# Patient Record
Sex: Female | Born: 1935 | Race: White | Hispanic: No | Smoking: Never smoker
Health system: Southern US, Community
[De-identification: ages and names within clinical notes are randomized; demographics above are authoritative.]

## PROBLEM LIST (undated history)

## (undated) DIAGNOSIS — K922 Gastrointestinal hemorrhage, unspecified: Secondary | ICD-10-CM

## (undated) DIAGNOSIS — I4891 Unspecified atrial fibrillation: Secondary | ICD-10-CM

## (undated) DIAGNOSIS — I10 Essential (primary) hypertension: Secondary | ICD-10-CM

## (undated) DIAGNOSIS — E079 Disorder of thyroid, unspecified: Secondary | ICD-10-CM

## (undated) HISTORY — PX: TONSILLECTOMY: SUR1361

## (undated) HISTORY — PX: APPENDECTOMY: SHX54

---

## 1977-04-01 HISTORY — PX: THYROIDECTOMY: SHX17

## 2004-07-02 ENCOUNTER — Inpatient Hospital Stay (HOSPITAL_COMMUNITY): Admission: EM | Admit: 2004-07-02 | Discharge: 2004-07-03 | Payer: Self-pay | Admitting: Emergency Medicine

## 2005-10-15 ENCOUNTER — Emergency Department (HOSPITAL_COMMUNITY): Admission: EM | Admit: 2005-10-15 | Discharge: 2005-10-15 | Payer: Self-pay | Admitting: Family Medicine

## 2005-12-30 ENCOUNTER — Encounter: Admission: RE | Admit: 2005-12-30 | Discharge: 2006-03-30 | Payer: Self-pay | Admitting: Endocrinology

## 2007-05-12 ENCOUNTER — Emergency Department (HOSPITAL_COMMUNITY): Admission: EM | Admit: 2007-05-12 | Discharge: 2007-05-12 | Payer: Self-pay | Admitting: Emergency Medicine

## 2010-01-16 ENCOUNTER — Emergency Department (HOSPITAL_COMMUNITY): Admission: EM | Admit: 2010-01-16 | Discharge: 2010-01-16 | Payer: Self-pay | Admitting: Family Medicine

## 2010-01-16 ENCOUNTER — Ambulatory Visit: Payer: Self-pay | Admitting: Internal Medicine

## 2010-01-16 ENCOUNTER — Inpatient Hospital Stay (HOSPITAL_COMMUNITY): Admission: EM | Admit: 2010-01-16 | Discharge: 2010-01-17 | Payer: Self-pay | Admitting: Emergency Medicine

## 2010-01-17 ENCOUNTER — Encounter (INDEPENDENT_AMBULATORY_CARE_PROVIDER_SITE_OTHER): Payer: Self-pay | Admitting: Emergency Medicine

## 2010-02-27 ENCOUNTER — Ambulatory Visit: Payer: Self-pay | Admitting: Internal Medicine

## 2010-02-27 DIAGNOSIS — I10 Essential (primary) hypertension: Secondary | ICD-10-CM

## 2010-02-27 DIAGNOSIS — I4891 Unspecified atrial fibrillation: Secondary | ICD-10-CM

## 2010-02-27 DIAGNOSIS — I480 Paroxysmal atrial fibrillation: Secondary | ICD-10-CM | POA: Insufficient documentation

## 2010-05-01 NOTE — Assessment & Plan Note (Signed)
Summary: eph/a fib/mt   Visit Type:  eph/a-fib  CC:  no complaints.  History of Present Illness: Kristi Morales him is seen following a hospitalization in mid October for atrial fibrillation which was found when she presented with symptoms of congestion. She was going quite rapidly with ventricular rate of 153. Overnight in the setting of Cardizem she converted to sinus rhythm she was discharged the following day. Evaluation at that time included an ultrasound that was normal. Coumadin was recommended for CHADS VASC score of 3. She declined. She has a history of negative Myoview in 2006.  creatinine at that time was 0.9  Current Medications (verified): 1)  Synthroid 88 Mcg Tabs (Levothyroxine Sodium) .Marland Kitchen.. 1 Tablet Once Daily 2)  Fish Oil 500 Mg Caps (Omega-3 Fatty Acids) .... Once Daily 3)  Aspirin 81 Mg Tbec (Aspirin) .... Take One Tablet By Mouth Two Times A Day 4)  B-12 Dots 500 Mcg Subl (Cyanocobalamin) .... Once Daily 5)  Oscal 500/200 D-3 500-200 Mg-Unit Tabs (Calcium Carbonate-Vitamin D) .... Once Daily  Allergies (verified): No Known Drug Allergies  Vital Signs:  Patient profile:   77 year old female Height:      61 inches Weight:      128.38 pounds BMI:     24.34 Pulse rate:   103 / minute BP sitting:   176 / 88  (left arm) Cuff size:   regular  Vitals Entered By: Caralee Ates CMA (February 27, 2010 3:23 PM)  Physical Exam  General:  The patient was alert and oriented in no acute distress. HEENT Normal.  Neck veins were flat, carotids were brisk.  Lungs were clear.  Heart sounds were rapid and irregular without murmurs or gallops.  Abdomen was soft with active bowel sounds. There is no clubbing cyanosis or edema. Skin Warm and dry    EKG  Procedure date:  02/27/2010  Findings:      sinus rhythm with frequent PACs at a rate of approximately 100 beats per minute intervals 0.04/2005/23 6 Axis is 27 RSR prime and occasional PVC  Impression &  Recommendations:  Problem # 1:  ATRIAL FIBRILLATION (ICD-427.31) the patient had asymptomatic atrial fibrillation.Hence there is no indication for antiarrhythmic therapy. we discussed the risk of thromboembolism and the appropriate indications for oral anticoagulation with her CHADS VASC score of 3. She declined Coumadin again. Given her age and gender, I don't think it's appropriate to consider pradaxa Her updated medication list for this problem includes:    Aspirin 81 Mg Tbec (Aspirin) .Marland Kitchen... Take one tablet by mouth two times a day  Problem # 2:  HYPERTENSION, BENIGN (ICD-401.1) will begin a beta blocker for both bp and evidence of sinus tachycardia  Her updated medication list for this problem includes:    Aspirin 81 Mg Tbec (Aspirin) .Marland Kitchen... Take one tablet by mouth two times a day  Problem # 3:  SINUS TACHYCARDIA (ICD-427.0) as above   synthroid dose recently decreased;  hgb was normal Her updated medication list for this problem includes:    Aspirin 81 Mg Tbec (Aspirin) .Marland Kitchen... Take one tablet by mouth two times a day  Patient Instructions: 1)  Your physician recommends that you schedule a follow-up appointment in: As needed. 2)  Your physician has recommended you make the following change in your medication: Try Atenolol, Metoprolol and Inderal LA seperately and let us know which medicine works best for you.

## 2010-06-13 LAB — CBC
HCT: 33.6 % — ABNORMAL LOW (ref 36.0–46.0)
Hemoglobin: 10.8 g/dL — ABNORMAL LOW (ref 12.0–15.0)
MCHC: 32.1 g/dL (ref 30.0–36.0)
MCV: 98.8 fL (ref 78.0–100.0)
Platelets: 254 10*3/uL (ref 150–400)
RBC: 4.07 MIL/uL (ref 3.87–5.11)
RDW: 13.7 % (ref 11.5–15.5)
WBC: 6.7 10*3/uL (ref 4.0–10.5)

## 2010-06-13 LAB — LIPID PANEL
Cholesterol: 180 mg/dL (ref 0–200)
HDL: 47 mg/dL (ref >39)
LDL Cholesterol: 121 mg/dL — ABNORMAL HIGH (ref 0–99)
Total CHOL/HDL Ratio: 3.8 RATIO

## 2010-06-13 LAB — POCT I-STAT, CHEM 8
BUN: 16 mg/dL (ref 6–23)
Chloride: 107 mEq/L (ref 96–112)
Sodium: 142 mEq/L (ref 135–145)
TCO2: 26 mmol/L (ref 0–100)

## 2010-06-13 LAB — CARDIAC PANEL(CRET KIN+CKTOT+MB+TROPI)
CK, MB: 1.5 ng/mL (ref 0.3–4.0)
CK, MB: 1.6 ng/mL (ref 0.3–4.0)
Total CK: 68 U/L (ref 7–177)
Troponin I: 0.02 ng/mL (ref 0.00–0.06)

## 2010-06-13 LAB — TROPONIN I: Troponin I: 0.01 ng/mL (ref 0.00–0.06)

## 2010-06-13 LAB — CK TOTAL AND CKMB (NOT AT ARMC): Relative Index: INVALID (ref 0.0–2.5)

## 2010-06-13 LAB — POCT CARDIAC MARKERS
Myoglobin, poc: 48.3 ng/mL (ref 12–200)
Myoglobin, poc: 60.5 ng/mL (ref 12–200)

## 2010-06-13 LAB — T4, FREE: Free T4: 1.33 ng/dL (ref 0.80–1.80)

## 2010-06-13 LAB — MAGNESIUM
Magnesium: 2.1 mg/dL (ref 1.5–2.5)
Magnesium: 2.3 mg/dL (ref 1.5–2.5)

## 2010-06-13 LAB — PROTIME-INR
INR: 0.99 (ref 0.00–1.49)
Prothrombin Time: 12.8 seconds (ref 11.6–15.2)

## 2010-06-13 LAB — URINALYSIS, ROUTINE W REFLEX MICROSCOPIC
Bilirubin Urine: NEGATIVE
Ketones, ur: 40 mg/dL — AB
Nitrite: NEGATIVE
Specific Gravity, Urine: 1.013 (ref 1.005–1.030)
Urobilinogen, UA: 0.2 mg/dL (ref 0.0–1.0)

## 2010-06-13 LAB — BRAIN NATRIURETIC PEPTIDE: Pro B Natriuretic peptide (BNP): 403 pg/mL — ABNORMAL HIGH (ref 0.0–100.0)

## 2010-06-13 LAB — TSH
TSH: 0.108 u[IU]/mL — ABNORMAL LOW (ref 0.350–4.500)
TSH: 0.149 u[IU]/mL — ABNORMAL LOW (ref 0.350–4.500)

## 2010-06-13 LAB — DIFFERENTIAL
Basophils Absolute: 0 10*3/uL (ref 0.0–0.1)
Lymphocytes Relative: 27 % (ref 12–46)
Lymphs Abs: 1.8 10*3/uL (ref 0.7–4.0)
Neutrophils Relative %: 60 % (ref 43–77)

## 2010-06-13 LAB — APTT: aPTT: 28 seconds (ref 24–37)

## 2010-08-17 NOTE — H&P (Signed)
NAMEJOHNYE, KIST NO.:  [id number]   MEDICAL RECORD NO.:  [id number]          PATIENT TYPE:  EMS   LOCATION:  MAJO                         FACILITY:  [facility]   PHYSICIAN:  Elmore Guise., M.D.DATE OF BIRTH:  Nov 19, 1927   DATE OF ADMISSION:  07/02/2004  DATE OF DISCHARGE:                                HISTORY & PHYSICAL   INDICATION FOR ADMISSION:  Exertional chest pain.   PRIMARY CARE PHYSICIAN:  Jeannett Senior A. Evlyn Kanner, M.D., [facility]   HISTORY OF PRESENT ILLNESS:  The patient is a 77 year old white female with  a past medical history of hypothyroidism, who presents with a 2-week episode  of off and on substernal chest pain.  The patient reports a normal state of  health until approximately 2 weeks ago when she started having exertional  chest tightness, associated with a lot of gas.  She first though this may  be GI involvement; however, she started to notice increasing palpitations  along with her chest pain.  She continued to do her normal activities, being  very active.  Yesterday, she walked 3.5 miles without any difficulty.  Today, she went to work.  She was lifting a small pot, started having 10/10  chest pressure, felt her heart racing and shipping.  She also noted mild  shortness of breath and lightheadedness during this episode.  She went and  laid down.  After 30 minutes, her pain and palpitations subsided.  EMS on  arrival reported that her heart rate was in the 130 range, and sinus  tachycardia with a blood pressure of 180/100.  Her symptoms resolved without  any significant treatment.  She is currently resting without any distress.   REVIEW OF SYSTEMS:  Otherwise negative.   CURRENT MEDICATIONS:  Synthroid 88 mcg daily.   ALLERGIES:  Questionable allergy with an antibiotic.   FAMILY HISTORY:  Positive for coronary disease.   SOCIAL HISTORY:  No tobacco.  An occasional glass of wine.  She does  continue to work full time.   PHYSICAL EXAMINATION:  VITAL SIGNS:  She is afebrile.  Blood pressure is  140/80, pulse is in the 90's, satting 95% on room air.  GENERAL:  She is a very pleasant, elderly white female, alert and oriented  x4, in no acute distress.  NECK:  Supple.  No lymphadenopathy.  There were 2+ carotids.  No JVD, no  bruits.  LUNGS:  Clear.  HEART:  Regular with a normal S1 and S2.  No significant murmurs, gallops,  or rubs.  ABDOMEN:  Soft, nontender, nondistended.  No rebound or guarding.  EXTREMITIES:  Warm with 2+ pulses and no edema.  NEUROLOGIC:  She is intact with no focal deficits.   LABORATORY WORK:  Myoglobin of 60, going to 50, going to 48.  MB of 1.3, to  1.4, to 1.8.  Troponin I less than 0.05 x3.  Renal function showed a BUN and  creatinine of 15 and 0.8.  LFT's are normal.  Coags are normal.  CBC has a  white count of 6.7, hemoglobin of 12.3, and platelets of 258.  Chest x-ray  shows no acute cardiopulmonary disease.  An ECG shows sinus tachycardia at a  rate of 113 per minute with no significant ST-T wave changes.   IMPRESSION:  1.  Chest pain (ongoing 2 week history of increasing chest pain).  2.  History of hypothyroidism.   PLAN:  Will admit for 24-hour observation and stress test in the morning.  Aspirin and Lovenox now.  Will check a fasting lipid panel in the morning.      TWK/MEDQ  D:  07/02/2004  T:  07/02/2004  Job:  [id number]   cc:   Jeannett Senior A. Evlyn Kanner, M.D.  [address]  [place]  [place] [address]  Fax: [phone]

## 2010-08-17 NOTE — Discharge Summary (Signed)
NAMECALIANN, LECKRONE NO.:  [id number]   MEDICAL RECORD NO.:  [id number]          PATIENT TYPE:  INP   LOCATION:  3707                         FACILITY:  [facility]   PHYSICIAN:  Elmore Guise., M.D.DATE OF BIRTH:  Jan 07, 1928   DATE OF ADMISSION:  07/02/2004  DATE OF DISCHARGE:  07/03/2004                                 DISCHARGE SUMMARY   DISCHARGE DIAGNOSES:  1.  Chest pain.  2.  History of hypothyroidism.   HISTORY OF PRESENT ILLNESS:  The patient is very pleasant 77 year old white  female admitted on July 02, 2004, with two-week history of increasing  exertional chest pain.   HOSPITAL COURSE:  The patient was admitted to telemetry floor. She underwent  serial cardiac enzymes which were negative. She underwent stress Myoview  myocardial perfusion scan on July 03, 2004, showing preserved LV systolic  function and no evidence of inducible ischemia. Following procedure, she has  had no further chest pain or pressure.   She will be discharged home today to continue the following medication:  1.  Synthroid 80 mcg daily.  2.  Aspirin 81 mg daily.   ACTIVITY:  The patient may return to her normal activities starting  tomorrow, July 04, 2004, with no restrictions.   FOLLOW-UP:  The patient will follow-up with Dr. Rosine Abe in two to  three weeks and Dr. Adrian Prince at [facility] at her regularly  scheduled visit.   Please note the patient did have hypertensive blood pressure response during  her stress test with systolic blood pressure in the 200 range. However, this  normalized after stress was complete. She will check her blood pressure as  an outpatient and should this remain elevated, will start on low-dose beta  blocker at her next visit.      TWK/MEDQ  D:  07/03/2004  T:  07/03/2004  Job:  [id number]   cc:   Jeannett Senior A. Evlyn Kanner, M.D.  [address]  [place]  [place] [address]  Fax: [phone]

## 2010-12-21 LAB — I-STAT 8, (EC8 V) (CONVERTED LAB)
BUN: 19
Glucose, Bld: 146 — ABNORMAL HIGH
Hemoglobin: 15
Potassium: 4.3
Sodium: 140

## 2010-12-21 LAB — CBC
HCT: 41
Hemoglobin: 13.7
WBC: 11.5 — ABNORMAL HIGH

## 2010-12-21 LAB — DIFFERENTIAL
Eosinophils Relative: 1
Lymphocytes Relative: 5 — ABNORMAL LOW
Lymphs Abs: 0.6 — ABNORMAL LOW
Monocytes Absolute: 0.5

## 2012-01-09 ENCOUNTER — Encounter: Payer: Self-pay | Admitting: Internal Medicine

## 2013-07-05 ENCOUNTER — Ambulatory Visit: Payer: Self-pay | Admitting: Podiatry

## 2013-11-01 DIAGNOSIS — M25519 Pain in unspecified shoulder: Secondary | ICD-10-CM | POA: Diagnosis not present

## 2013-11-01 DIAGNOSIS — E559 Vitamin D deficiency, unspecified: Secondary | ICD-10-CM | POA: Diagnosis not present

## 2013-11-01 DIAGNOSIS — I4891 Unspecified atrial fibrillation: Secondary | ICD-10-CM | POA: Diagnosis not present

## 2013-11-01 DIAGNOSIS — R030 Elevated blood-pressure reading, without diagnosis of hypertension: Secondary | ICD-10-CM | POA: Diagnosis not present

## 2013-11-01 DIAGNOSIS — D649 Anemia, unspecified: Secondary | ICD-10-CM | POA: Diagnosis not present

## 2013-11-01 DIAGNOSIS — E785 Hyperlipidemia, unspecified: Secondary | ICD-10-CM | POA: Diagnosis not present

## 2013-11-01 DIAGNOSIS — M899 Disorder of bone, unspecified: Secondary | ICD-10-CM | POA: Diagnosis not present

## 2013-11-01 DIAGNOSIS — M949 Disorder of cartilage, unspecified: Secondary | ICD-10-CM | POA: Diagnosis not present

## 2013-11-01 DIAGNOSIS — E039 Hypothyroidism, unspecified: Secondary | ICD-10-CM | POA: Diagnosis not present

## 2013-11-24 DIAGNOSIS — M949 Disorder of cartilage, unspecified: Secondary | ICD-10-CM | POA: Diagnosis not present

## 2013-11-24 DIAGNOSIS — M899 Disorder of bone, unspecified: Secondary | ICD-10-CM | POA: Diagnosis not present

## 2013-12-27 DIAGNOSIS — M7500 Adhesive capsulitis of unspecified shoulder: Secondary | ICD-10-CM | POA: Diagnosis not present

## 2014-01-03 DIAGNOSIS — M7501 Adhesive capsulitis of right shoulder: Secondary | ICD-10-CM | POA: Diagnosis not present

## 2014-03-09 DIAGNOSIS — Z23 Encounter for immunization: Secondary | ICD-10-CM | POA: Diagnosis not present

## 2014-05-10 DIAGNOSIS — E559 Vitamin D deficiency, unspecified: Secondary | ICD-10-CM | POA: Diagnosis not present

## 2014-05-10 DIAGNOSIS — M859 Disorder of bone density and structure, unspecified: Secondary | ICD-10-CM | POA: Diagnosis not present

## 2014-05-10 DIAGNOSIS — I4891 Unspecified atrial fibrillation: Secondary | ICD-10-CM | POA: Diagnosis not present

## 2014-05-10 DIAGNOSIS — Z6824 Body mass index (BMI) 24.0-24.9, adult: Secondary | ICD-10-CM | POA: Diagnosis not present

## 2014-05-10 DIAGNOSIS — E039 Hypothyroidism, unspecified: Secondary | ICD-10-CM | POA: Diagnosis not present

## 2014-06-02 ENCOUNTER — Ambulatory Visit (HOSPITAL_COMMUNITY)
Admission: RE | Admit: 2014-06-02 | Discharge: 2014-06-02 | Disposition: A | Discharge status: Home / Self Care | Payer: Medicare Other | Source: Ambulatory Visit | Attending: Endocrinology | Admitting: Endocrinology

## 2014-06-02 DIAGNOSIS — Z538 Procedure and treatment not carried out for other reasons: Secondary | ICD-10-CM | POA: Insufficient documentation

## 2014-06-02 DIAGNOSIS — R030 Elevated blood-pressure reading, without diagnosis of hypertension: Secondary | ICD-10-CM | POA: Diagnosis not present

## 2014-06-02 DIAGNOSIS — I4891 Unspecified atrial fibrillation: Secondary | ICD-10-CM | POA: Diagnosis not present

## 2014-06-02 DIAGNOSIS — M859 Disorder of bone density and structure, unspecified: Secondary | ICD-10-CM | POA: Diagnosis not present

## 2014-06-02 DIAGNOSIS — Z6825 Body mass index (BMI) 25.0-25.9, adult: Secondary | ICD-10-CM | POA: Diagnosis not present

## 2014-06-02 DIAGNOSIS — E039 Hypothyroidism, unspecified: Secondary | ICD-10-CM | POA: Diagnosis not present

## 2014-06-02 NOTE — Progress Notes (Signed)
Pt arrived today ( a day early for her scheduled appointment ) for her RECLAST infusion. BP 125/94. Pt is warm and dry color pink and voicing no c/o. Her radial pulse was very irregular at a rate of 135-140. Pt denies any cardiac problems and looking at the list of medications in EPIC, she states "all I take is thyroid medication". She says she stopped her 81mg  aspirin, calcium and vitamin D and fish oil.I called Dr Rinaldo CloudSouth's office and spoke to [employer]my Roger's RN for Dr Evlyn KannerSouth and relayed this information to her.Per office notes per Amy pt has "history of afib and taking a 81 mg aspirin" RECLAST infusion was postponed today and.Amy said patient could be seen in the office today by 3pm .  I informed patient of this and she left Short Stay accompanied by friend to got to office. I asked that Amy notify Short Stay if they would like to infuse the RECLAST at another time pending patient's evaluation at office visit.

## 2014-06-03 ENCOUNTER — Other Ambulatory Visit (HOSPITAL_COMMUNITY): Payer: Self-pay | Admitting: Endocrinology

## 2014-06-03 ENCOUNTER — Ambulatory Visit (HOSPITAL_COMMUNITY): Admission: RE | Admit: 2014-06-03 | Payer: Medicare Other | Source: Ambulatory Visit

## 2014-07-04 DIAGNOSIS — E039 Hypothyroidism, unspecified: Secondary | ICD-10-CM | POA: Diagnosis not present

## 2014-07-04 DIAGNOSIS — Z7901 Long term (current) use of anticoagulants: Secondary | ICD-10-CM | POA: Diagnosis not present

## 2014-07-04 DIAGNOSIS — I4891 Unspecified atrial fibrillation: Secondary | ICD-10-CM | POA: Diagnosis not present

## 2014-07-04 DIAGNOSIS — D649 Anemia, unspecified: Secondary | ICD-10-CM | POA: Diagnosis not present

## 2014-07-04 DIAGNOSIS — E785 Hyperlipidemia, unspecified: Secondary | ICD-10-CM | POA: Diagnosis not present

## 2014-07-04 DIAGNOSIS — G459 Transient cerebral ischemic attack, unspecified: Secondary | ICD-10-CM | POA: Diagnosis not present

## 2014-07-04 DIAGNOSIS — R030 Elevated blood-pressure reading, without diagnosis of hypertension: Secondary | ICD-10-CM | POA: Diagnosis not present

## 2014-07-04 DIAGNOSIS — Z6824 Body mass index (BMI) 24.0-24.9, adult: Secondary | ICD-10-CM | POA: Diagnosis not present

## 2014-07-06 DIAGNOSIS — I340 Nonrheumatic mitral (valve) insufficiency: Secondary | ICD-10-CM | POA: Diagnosis not present

## 2014-07-06 DIAGNOSIS — Z7901 Long term (current) use of anticoagulants: Secondary | ICD-10-CM | POA: Diagnosis not present

## 2014-07-06 DIAGNOSIS — I4891 Unspecified atrial fibrillation: Secondary | ICD-10-CM | POA: Diagnosis not present

## 2014-07-06 DIAGNOSIS — G459 Transient cerebral ischemic attack, unspecified: Secondary | ICD-10-CM | POA: Diagnosis not present

## 2014-12-28 ENCOUNTER — Encounter (HOSPITAL_COMMUNITY): Payer: Self-pay | Admitting: Family Medicine

## 2014-12-28 ENCOUNTER — Emergency Department (INDEPENDENT_AMBULATORY_CARE_PROVIDER_SITE_OTHER)
Admission: EM | Admit: 2014-12-28 | Discharge: 2014-12-28 | Disposition: A | Discharge status: Home / Self Care | Payer: Medicare Other | Source: Home / Self Care | Attending: Family Medicine | Admitting: Family Medicine

## 2014-12-28 DIAGNOSIS — R609 Edema, unspecified: Secondary | ICD-10-CM | POA: Diagnosis not present

## 2014-12-28 DIAGNOSIS — I10 Essential (primary) hypertension: Secondary | ICD-10-CM | POA: Diagnosis not present

## 2014-12-28 DIAGNOSIS — I8310 Varicose veins of unspecified lower extremity with inflammation: Secondary | ICD-10-CM | POA: Diagnosis not present

## 2014-12-28 DIAGNOSIS — I4891 Unspecified atrial fibrillation: Secondary | ICD-10-CM | POA: Diagnosis not present

## 2014-12-28 DIAGNOSIS — I160 Hypertensive urgency: Secondary | ICD-10-CM

## 2014-12-28 DIAGNOSIS — I872 Venous insufficiency (chronic) (peripheral): Secondary | ICD-10-CM

## 2014-12-28 HISTORY — DX: Disorder of thyroid, unspecified: E07.9

## 2014-12-28 HISTORY — DX: Unspecified atrial fibrillation: I48.91

## 2014-12-28 NOTE — ED Notes (Signed)
Pt has a rash on both lower extremities.  Dr. Konrad Dolores triaged and assessed the pt.

## 2014-12-28 NOTE — ED Provider Notes (Signed)
CSN: [id number]     Arrival date & time 12/28/14  1500 History   First MD Initiated Contact with Patient 12/28/14 1640     Chief Complaint  Patient presents with  . Rash   (Consider location/radiation/quality/duration/timing/severity/associated sxs/prior Treatment) HPI   Left ankle rash. Medial side. Associated with lower extremity swelling bilaterally. This is all going on for 3 weeks. Rash is fairly constant. Denies any pain or itch. Denies any injury to ankle. States that she's been in her normal state of health. She is not tried anything for the rash. States that the swelling improves as she rests and is minimal in the morning. Denies any chest pain, palpitations, shortness of breath, LOC, lightheadedness, dizziness, headache, neck stiffness, fevers.  Past Medical History  Diagnosis Date  . A-fib   . Thyroid disease    History reviewed. No pertinent past surgical history. Family History  Problem Relation Age of Onset  . Cancer Neg Hx   . Diabetes Neg Hx   . Heart failure Neg Hx   . Hyperlipidemia Neg Hx    Social History  Substance Use Topics  . Smoking status: Never Smoker   . Smokeless tobacco: None  . Alcohol Use: Yes   OB History    No data available     Review of Systems Per HPI with all other pertinent systems negative.   Allergies  Review of patient's allergies indicates no known allergies.  Home Medications   Prior to Admission medications   Medication Sig Start Date End Date Taking? Authorizing Tashianna Broome  aspirin 81 MG tablet Take 81 mg by mouth daily.    Historical Anoop Hemmer, MD  Calcium Carbonate-Vitamin D (OSCAL 500/200 D-3 PO) Take by mouth.    Historical Alexsis Kathman, MD  Cyanocobalamin (VITAMIN B-12) 500 MCG SUBL Place under the tongue.    Historical Jaser Fullen, MD  fish oil-omega-3 fatty acids 1000 MG capsule Take 2 g by mouth daily.    Historical Creek Gan, MD  levothyroxine (SYNTHROID, LEVOTHROID) 88 MCG tablet Take 88 mcg by mouth daily.    Historical  Pink Maye, MD   Meds Ordered and Administered this Visit  Medications - No data to display  BP 175/105 mmHg  Pulse 154  SpO2 95% No data found.   Physical Exam Physical Exam  Constitutional: oriented to person, place, and time. appears well-developed and well-nourished. No distress.  HENT:  Head: Normocephalic and atraumatic.  Eyes: EOMI. PERRL.  Neck: Normal range of motion.  Cardiovascular: Irregularly irregular heartbeat, 2/6 systolic murmur, 2+ distal pulses  Pulmonary/Chest: Effort normal and breath sounds normal. No respiratory distress.  Abdominal: Soft. Bowel sounds are normal. NonTTP, no distension.  Musculoskeletal: Normal range of motion. Non ttp, no effusion.  Neurological: alert and oriented to person, place, and time.  Skin: Mild left medial and right medial ankle hyperemia. Nontender to palpation, nonindurated, no fluctuance.Marland Kitchen  Psychiatric: normal mood and affect. behavior is normal. Judgment and thought content normal.    ED Course  Procedures (including critical care time)  Labs Review Labs Reviewed - No data to display  Imaging Review No results found.   Visual Acuity Review  Right Eye Distance:   Left Eye Distance:   Bilateral Distance:    Right Eye Near:   Left Eye Near:    Bilateral Near:         MDM   1. Venous stasis dermatitis, unspecified laterality   2. Dependent edema   3. Atrial fibrillation with rapid ventricular response   4. Hypertensive  urgency     Calcium on improving venous stasis and dependent edema using compression stockings. Discussed likelihood of this rash persisting and possibly getting worse with time. Of considerable concern was the patient's elevated blood pressure and obvious state of A. fib and RVR. Patient is refusing all medical intervention at this time. Patient states that she takes no blood pressure medications and has refused them in the past when she has had an elevated blood pressure. She states that this  HAPPENS when she goes to the doctor and it improves after she rests. Patient was allowed to rest in the clinic for partially 20 minutes with no improvement of her heart rate or blood pressure. Patient was again counseled to go to the emergency room for treatment of her hypertensive state and rapid ventricular rate area again patient refusing further treatment for this condition despite being warned about the serious possible complications including stroke, heart attack, and other end organ injury.    Ozella Rocks, MD 12/28/14 [phone]

## 2014-12-28 NOTE — Discharge Instructions (Signed)
Rash in her legs is from rupture of small capillaries due to the swelling from poor venous flow. These skin color changes will persist and slowly lighten and turned more of a brownish color. The more your legs are allowed to swell the worse is probable get. Please consider wearing compression stockings to help with this. Of greater concern is your extreme blood pressure and heart rate. If left untreated this could predispose to 2 heart attacks and strokes. If your heart rate and blood pressure does not improve please go to the emergency room for further management.

## 2015-02-10 DIAGNOSIS — D649 Anemia, unspecified: Secondary | ICD-10-CM | POA: Diagnosis not present

## 2015-02-10 DIAGNOSIS — M859 Disorder of bone density and structure, unspecified: Secondary | ICD-10-CM | POA: Diagnosis not present

## 2015-02-10 DIAGNOSIS — Z6824 Body mass index (BMI) 24.0-24.9, adult: Secondary | ICD-10-CM | POA: Diagnosis not present

## 2015-02-10 DIAGNOSIS — E038 Other specified hypothyroidism: Secondary | ICD-10-CM | POA: Diagnosis not present

## 2015-02-10 DIAGNOSIS — R51 Headache: Secondary | ICD-10-CM | POA: Diagnosis not present

## 2015-02-10 DIAGNOSIS — E559 Vitamin D deficiency, unspecified: Secondary | ICD-10-CM | POA: Diagnosis not present

## 2015-02-10 DIAGNOSIS — I10 Essential (primary) hypertension: Secondary | ICD-10-CM | POA: Diagnosis not present

## 2015-02-10 DIAGNOSIS — Z23 Encounter for immunization: Secondary | ICD-10-CM | POA: Diagnosis not present

## 2015-02-10 DIAGNOSIS — I4891 Unspecified atrial fibrillation: Secondary | ICD-10-CM | POA: Diagnosis not present

## 2015-02-10 DIAGNOSIS — E784 Other hyperlipidemia: Secondary | ICD-10-CM | POA: Diagnosis not present

## 2015-02-13 ENCOUNTER — Telehealth (HOSPITAL_COMMUNITY): Payer: Self-pay | Admitting: *Deleted

## 2015-02-27 ENCOUNTER — Telehealth (HOSPITAL_COMMUNITY): Payer: Self-pay | Admitting: *Deleted

## 2015-07-31 DIAGNOSIS — K922 Gastrointestinal hemorrhage, unspecified: Secondary | ICD-10-CM

## 2015-07-31 HISTORY — DX: Gastrointestinal hemorrhage, unspecified: K92.2

## 2015-08-08 ENCOUNTER — Inpatient Hospital Stay (HOSPITAL_COMMUNITY)
Admission: EM | Admit: 2015-08-08 | Discharge: 2015-08-11 | DRG: 378 | Disposition: A | Discharge status: Home / Self Care | Payer: Medicare Other | Attending: Internal Medicine | Admitting: Internal Medicine

## 2015-08-08 ENCOUNTER — Emergency Department (HOSPITAL_COMMUNITY): Payer: Medicare Other

## 2015-08-08 ENCOUNTER — Inpatient Hospital Stay (HOSPITAL_COMMUNITY): Payer: Medicare Other

## 2015-08-08 ENCOUNTER — Encounter (HOSPITAL_COMMUNITY): Payer: Self-pay | Admitting: Emergency Medicine

## 2015-08-08 DIAGNOSIS — Z79899 Other long term (current) drug therapy: Secondary | ICD-10-CM

## 2015-08-08 DIAGNOSIS — D62 Acute posthemorrhagic anemia: Secondary | ICD-10-CM | POA: Diagnosis present

## 2015-08-08 DIAGNOSIS — K5731 Diverticulosis of large intestine without perforation or abscess with bleeding: Secondary | ICD-10-CM | POA: Diagnosis present

## 2015-08-08 DIAGNOSIS — E039 Hypothyroidism, unspecified: Secondary | ICD-10-CM | POA: Diagnosis present

## 2015-08-08 DIAGNOSIS — Z66 Do not resuscitate: Secondary | ICD-10-CM | POA: Diagnosis present

## 2015-08-08 DIAGNOSIS — K922 Gastrointestinal hemorrhage, unspecified: Secondary | ICD-10-CM | POA: Diagnosis present

## 2015-08-08 DIAGNOSIS — I4891 Unspecified atrial fibrillation: Secondary | ICD-10-CM

## 2015-08-08 DIAGNOSIS — R001 Bradycardia, unspecified: Secondary | ICD-10-CM | POA: Diagnosis present

## 2015-08-08 DIAGNOSIS — R42 Dizziness and giddiness: Secondary | ICD-10-CM | POA: Diagnosis not present

## 2015-08-08 DIAGNOSIS — I959 Hypotension, unspecified: Secondary | ICD-10-CM | POA: Diagnosis not present

## 2015-08-08 DIAGNOSIS — K625 Hemorrhage of anus and rectum: Secondary | ICD-10-CM | POA: Diagnosis not present

## 2015-08-08 DIAGNOSIS — K2921 Alcoholic gastritis with bleeding: Secondary | ICD-10-CM

## 2015-08-08 DIAGNOSIS — E876 Hypokalemia: Secondary | ICD-10-CM | POA: Diagnosis present

## 2015-08-08 DIAGNOSIS — I480 Paroxysmal atrial fibrillation: Secondary | ICD-10-CM | POA: Diagnosis present

## 2015-08-08 DIAGNOSIS — I10 Essential (primary) hypertension: Secondary | ICD-10-CM | POA: Diagnosis present

## 2015-08-08 DIAGNOSIS — R404 Transient alteration of awareness: Secondary | ICD-10-CM | POA: Diagnosis not present

## 2015-08-08 HISTORY — DX: Essential (primary) hypertension: I10

## 2015-08-08 HISTORY — DX: Gastrointestinal hemorrhage, unspecified: K92.2

## 2015-08-08 LAB — GLUCOSE, CAPILLARY
GLUCOSE-CAPILLARY: 123 mg/dL — AB (ref 65–99)
Glucose-Capillary: 109 mg/dL — ABNORMAL HIGH (ref 65–99)
Glucose-Capillary: 108 mg/dL — ABNORMAL HIGH (ref 65–99)

## 2015-08-08 LAB — PREPARE FRESH FROZEN PLASMA
UNIT DIVISION: 0
Unit division: 0

## 2015-08-08 LAB — CBC
HCT: 25.5 % — ABNORMAL LOW (ref 36.0–46.0)
HEMATOCRIT: 23.4 % — AB (ref 36.0–46.0)
HEMOGLOBIN: 8.4 g/dL — AB (ref 12.0–15.0)
Hemoglobin: 7.8 g/dL — ABNORMAL LOW (ref 12.0–15.0)
MCH: 29.6 pg (ref 26.0–34.0)
MCH: 29.9 pg (ref 26.0–34.0)
MCHC: 32.9 g/dL (ref 30.0–36.0)
MCHC: 33.3 g/dL (ref 30.0–36.0)
MCV: 89.8 fL (ref 78.0–100.0)
MCV: 89.7 fL (ref 78.0–100.0)
Platelets: 134 10*3/uL — ABNORMAL LOW (ref 150–400)
Platelets: 145 10*3/uL — ABNORMAL LOW (ref 150–400)
RBC: 2.84 MIL/uL — AB (ref 3.87–5.11)
RBC: 2.61 MIL/uL — AB (ref 3.87–5.11)
RDW: 17.2 % — ABNORMAL HIGH (ref 11.5–15.5)
RDW: 17.6 % — AB (ref 11.5–15.5)
WBC: 6.9 10*3/uL (ref 4.0–10.5)
WBC: 8.6 10*3/uL (ref 4.0–10.5)

## 2015-08-08 LAB — CBC WITH DIFFERENTIAL/PLATELET
BASOS PCT: 0 %
Basophils Absolute: 0 10*3/uL (ref 0.0–0.1)
Eosinophils Absolute: 0.1 10*3/uL (ref 0.0–0.7)
Eosinophils Relative: 1 %
HEMATOCRIT: 25.5 % — AB (ref 36.0–46.0)
HEMOGLOBIN: 7.9 g/dL — AB (ref 12.0–15.0)
Lymphocytes Relative: 16 %
Lymphs Abs: 1.2 10*3/uL (ref 0.7–4.0)
MCH: 31.2 pg (ref 26.0–34.0)
MCHC: 31 g/dL (ref 30.0–36.0)
MCV: 100.8 fL — ABNORMAL HIGH (ref 78.0–100.0)
MONOS PCT: 5 %
Monocytes Absolute: 0.4 10*3/uL (ref 0.1–1.0)
NEUTROS ABS: 6.2 10*3/uL (ref 1.7–7.7)
NEUTROS PCT: 78 %
Platelets: 217 10*3/uL (ref 150–400)
RBC: 2.53 MIL/uL — AB (ref 3.87–5.11)
RDW: 14.5 % (ref 11.5–15.5)
WBC: 8 10*3/uL (ref 4.0–10.5)

## 2015-08-08 LAB — COMPREHENSIVE METABOLIC PANEL
ALBUMIN: 3 g/dL — AB (ref 3.5–5.0)
ALBUMIN: 2.2 g/dL — AB (ref 3.5–5.0)
ALK PHOS: 54 U/L (ref 38–126)
ALT: 8 U/L — AB (ref 14–54)
ALT: 7 U/L — AB (ref 14–54)
ANION GAP: 11 (ref 5–15)
AST: 17 U/L (ref 15–41)
AST: 14 U/L — AB (ref 15–41)
Alkaline Phosphatase: 34 U/L — ABNORMAL LOW (ref 38–126)
Anion gap: 9 (ref 5–15)
BILIRUBIN TOTAL: 0.9 mg/dL (ref 0.3–1.2)
BUN: 22 mg/dL — AB (ref 6–20)
BUN: 14 mg/dL (ref 6–20)
CALCIUM: 7 mg/dL — AB (ref 8.9–10.3)
CHLORIDE: 113 mmol/L — AB (ref 101–111)
CO2: 22 mmol/L (ref 22–32)
CO2: 19 mmol/L — ABNORMAL LOW (ref 22–32)
CREATININE: 0.83 mg/dL (ref 0.44–1.00)
CREATININE: 0.63 mg/dL (ref 0.44–1.00)
Calcium: 6.2 mg/dL — CL (ref 8.9–10.3)
Chloride: 105 mmol/L (ref 101–111)
GFR calc Af Amer: >60 mL/min (ref >60)
GFR calc Af Amer: >60 mL/min (ref >60)
GFR calc non Af Amer: >60 mL/min (ref >60)
GLUCOSE: 139 mg/dL — AB (ref 65–99)
Glucose, Bld: 162 mg/dL — ABNORMAL HIGH (ref 65–99)
POTASSIUM: 3.8 mmol/L (ref 3.5–5.1)
Potassium: 3.8 mmol/L (ref 3.5–5.1)
Sodium: 138 mmol/L (ref 135–145)
Sodium: 141 mmol/L (ref 135–145)
TOTAL PROTEIN: 5.3 g/dL — AB (ref 6.5–8.1)
Total Bilirubin: 1 mg/dL (ref 0.3–1.2)
Total Protein: 4.2 g/dL — ABNORMAL LOW (ref 6.5–8.1)

## 2015-08-08 LAB — PROTIME-INR
INR: 1.23 (ref 0.00–1.49)
Prothrombin Time: 15.7 seconds — ABNORMAL HIGH (ref 11.6–15.2)

## 2015-08-08 LAB — LACTIC ACID, PLASMA: LACTIC ACID, VENOUS: 1 mmol/L (ref 0.5–2.0)

## 2015-08-08 LAB — MRSA PCR SCREENING: MRSA by PCR: NEGATIVE

## 2015-08-08 LAB — DIGOXIN LEVEL: Digoxin Level: 0.9 ng/mL (ref 0.8–2.0)

## 2015-08-08 LAB — PREPARE RBC (CROSSMATCH)

## 2015-08-08 LAB — TROPONIN I: Troponin I: <0.03 ng/mL (ref <0.031)

## 2015-08-08 LAB — POC OCCULT BLOOD, ED: Fecal Occult Bld: POSITIVE — AB

## 2015-08-08 LAB — ABO/RH: ABO/RH(D): O NEG

## 2015-08-08 LAB — TSH: TSH: 0.13 u[IU]/mL — ABNORMAL LOW (ref 0.350–4.500)

## 2015-08-08 MED ORDER — PANTOPRAZOLE SODIUM 40 MG IV SOLR
40.0000 mg | INTRAVENOUS | Status: DC
  Administered 2015-08-08 – 2015-08-11 (×3): 40 mg via INTRAVENOUS
  Filled 2015-08-08 (×3): qty 40

## 2015-08-08 MED ORDER — ACETAMINOPHEN 325 MG PO TABS: 650.0000 mg | ORAL_TABLET | Freq: Four times a day (QID) | ORAL | Status: DC | PRN

## 2015-08-08 MED ORDER — ACETAMINOPHEN 650 MG RE SUPP: 650.0000 mg | Freq: Four times a day (QID) | RECTAL | Status: DC | PRN

## 2015-08-08 MED ORDER — LEVOTHYROXINE SODIUM 100 MCG IV SOLR: 50.0000 ug | Freq: Every day | INTRAVENOUS | Status: DC

## 2015-08-08 MED ORDER — SODIUM CHLORIDE 0.9 % IV SOLN: INTRAVENOUS | Status: DC

## 2015-08-08 MED ORDER — TECHNETIUM TC 99M-LABELED RED BLOOD CELLS IV KIT
26.1000 | PACK | Freq: Once | INTRAVENOUS | Status: AC | PRN
  Administered 2015-08-08: 26.1 via INTRAVENOUS

## 2015-08-08 MED ORDER — AMIODARONE HCL IN DEXTROSE 360-4.14 MG/200ML-% IV SOLN
30.0000 mg/h | INTRAVENOUS | Status: DC
  Administered 2015-08-08 – 2015-08-09 (×4): 30 mg/h via INTRAVENOUS
  Filled 2015-08-08 (×4): qty 200

## 2015-08-08 MED ORDER — SODIUM CHLORIDE 0.9 % IV SOLN
1.0000 g | Freq: Once | INTRAVENOUS | Status: AC
  Administered 2015-08-08: 1 g via INTRAVENOUS
  Filled 2015-08-08: qty 10

## 2015-08-08 MED ORDER — SODIUM CHLORIDE 0.9 % IV SOLN: Freq: Once | INTRAVENOUS | Status: DC

## 2015-08-08 MED ORDER — ONDANSETRON HCL 4 MG PO TABS
4.0000 mg | ORAL_TABLET | Freq: Four times a day (QID) | ORAL | Status: DC | PRN
  Administered 2015-08-09: 4 mg via ORAL
  Filled 2015-08-08: qty 1

## 2015-08-08 MED ORDER — AMIODARONE LOAD VIA INFUSION
150.0000 mg | Freq: Once | INTRAVENOUS | Status: AC
  Administered 2015-08-08: 150 mg via INTRAVENOUS
  Filled 2015-08-08: qty 83.34

## 2015-08-08 MED ORDER — AMIODARONE HCL IN DEXTROSE 360-4.14 MG/200ML-% IV SOLN
60.0000 mg/h | INTRAVENOUS | Status: AC
  Administered 2015-08-08 (×2): 60 mg/h via INTRAVENOUS
  Filled 2015-08-08: qty 400

## 2015-08-08 MED ORDER — ONDANSETRON HCL 4 MG/2ML IJ SOLN
4.0000 mg | Freq: Four times a day (QID) | INTRAMUSCULAR | Status: DC | PRN
  Administered 2015-08-09: 4 mg via INTRAVENOUS
  Filled 2015-08-08: qty 2

## 2015-08-08 MED ORDER — SODIUM CHLORIDE 0.9 % IV SOLN
10.0000 mL/h | Freq: Once | INTRAVENOUS | Status: AC
  Administered 2015-08-08: 10 mL/h via INTRAVENOUS

## 2015-08-08 MED ORDER — DILTIAZEM HCL 100 MG IV SOLR
5.0000 mg/h | INTRAVENOUS | Status: DC
  Administered 2015-08-08: 5 mg/h via INTRAVENOUS
  Filled 2015-08-08 (×2): qty 100

## 2015-08-08 MED ORDER — DILTIAZEM LOAD VIA INFUSION
10.0000 mg | Freq: Once | INTRAVENOUS | Status: AC
  Administered 2015-08-08: 10 mg via INTRAVENOUS
  Filled 2015-08-08: qty 10

## 2015-08-08 NOTE — ED Notes (Signed)
Patient transported to X-ray 

## 2015-08-08 NOTE — Care Management Note (Signed)
Case Management Note  Patient Details  Name: Kristi Morales MRN: [id number] Date of Birth: 09/23/1927  Subjective/Objective:  Patient is from home with gib, has gotten 3 units, now in afib with rvr at 180, on amio drip, NCM will cont to follow for dc needs.                   Action/Plan:   Expected Discharge Date:                  Expected Discharge Plan:  Home w Home Health Services  In-House Referral:     Discharge planning Services  CM Consult  Post Acute Care Choice:    Choice offered to:     DME Arranged:    DME Agency:     HH Arranged:    HH Agency:     Status of Service:  In process, will continue to follow  Medicare Important Message Given:    Date Medicare IM Given:    Medicare IM give by:    Date Additional Medicare IM Given:    Additional Medicare Important Message give by:     If discussed at Long Length of Stay Meetings, dates discussed:    Additional Comments:  Leone Havenaylor, Marlyss Cissell Clinton, RN 08/08/2015, 1:09 PM

## 2015-08-08 NOTE — Consult Note (Signed)
Cardiology Consult    Patient ID: Kristi Morales MRN: [id number], DOB/AGE: 80-Mar-1929   Admit date: 08/08/2015 Date of Consult: 08/08/2015  Primary Physician: Julian Hy, MD Primary Cardiologist: Dr. Clide Cliff Requesting Provider: Dr. Nancy Marus  Patient Profile    82 y/o female with PMH of A-Fib/HTN and Hypothyroidism who presented to the [facility] ED with rectal bleeding on 08/08/2015 and noted to be in A-fib RVR.   Past Medical History   Past Medical History  Diagnosis Date  . A-fib (HCC)   . Thyroid disease   . HTN (hypertension)     History reviewed. No pertinent past surgical history.   Allergies  No Known Allergies  History of Present Illness    [name] with PMH of A-Fib/HTN and Hypothyriodism. Her last office visit was 04/2010 where she was seen for hospital follow up for the dx of A-Fib. During her hospitalization, it was noted that her CHADS VASC score of 3, and she declined. States she was Dr. Evlyn Kanner last Monday and her heart rate was noted to be in the 140s, and he increased her metoprolol from 25 to 50mg  daily. According to her, her heart rate is always elevated. Has not followed up with cardiology since seeing Dr. Clide Cliff.   Most recently she reports being in her normal state of health, and working as a Conservation officer, nature at a middle school part-time. Yesterday, she felt well went getting home from work, but says around 8pm she began to have bright red rectal bleeding, with mild abdominal pain. Reports this proceeded to happen 5 more times, before she called EMS for transport to the hospital. She did take one dose of Lopressor 25mg  and digoxin prior to EMS arrival. Does report dizziness, palpitations, and lower abd pain with the bleeding, but denies any chest pain/pressure, dyspnea, PND, or LLE.   With EMS she was noted to be in A-Fib RVR, with a rate of 190. She was given IV Cardizem 10mg , which initally decreased her heart rate into the 120s, but became  hypotensive. In the ED she was found to be anemic with a hemoglobin of 7.9 and during evaluation in the ED she suddenly became unresponsive with a heart rate in the 30s. There was question about possible tachy/bradycardia syndrome. IV fluids were continued, along with blood transfusion.   Internal medicine was called for admission, and she was admitted to step-down. She is currently receiving her 3rd Unit of PRBCs, but remains in a-fib with rates between 120-180s.   Inpatient Medications    . sodium chloride   Intravenous Once  . levothyroxine  50 mcg Intravenous Daily  . pantoprazole (PROTONIX) IV  40 mg Intravenous Q24H    Family History    Family History  Problem Relation Age of Onset  . Cancer Neg Hx   . Diabetes Neg Hx   . Heart failure Neg Hx   . Hyperlipidemia Neg Hx     Social History    Social History   Social History  . Marital Status: Widowed    Spouse Name: N/A  . Number of Children: N/A  . Years of Education: N/A   Occupational History  . Not on file.   Social History Main Topics  . Smoking status: Never Smoker   . Smokeless tobacco: Not on file  . Alcohol Use: Yes  . Drug Use: No  . Sexual Activity: Not on file   Other Topics Concern  . Not on file   Social  History Narrative     Review of Systems    General:  No chills, fever, night sweats or weight changes.  Cardiovascular:  No chest pain, dyspnea on exertion, edema, orthopnea, + palpitations, paroxysmal nocturnal dyspnea. Dermatological: No rash, lesions/masses Respiratory: No cough, dyspnea Urologic: No hematuria, dysuria Abdominal:   No nausea, vomiting, diarrhea, + bright red blood per rectum, melena, or hematemesis Neurologic:  No visual changes, wkns, changes in mental status. All other systems reviewed and are otherwise negative except as noted above.  Physical Exam    Blood pressure 126/71, pulse 96, temperature 97.8 F (36.6 C), temperature source Oral, resp. rate 26, height 5'  (1.524 m), weight 119 lb 7.8 oz (54.2 kg), SpO2 91 %.  General: Pleasant older female, NAD Psych: Normal affect. Neuro: Alert and oriented X 3. Moves all extremities spontaneously. HEENT: Normal  Neck: Supple without bruits or JVD. Lungs:  Resp regular and unlabored, CTA. Heart: irregular rate, tachy s3, s4, or murmurs. Abdomen: Soft, non-tender, non-distended, BS + x 4.  Extremities: No clubbing, cyanosis or edema. DP/PT/Radials 2+ and equal bilaterally.  Labs    Troponin [facility] of Care Test)  Recent Labs  08/08/15 0207  TROPONINI <0.03   Lab Results  Component Value Date   WBC 8.0 08/08/2015   HGB 7.9* 08/08/2015   HCT 25.5* 08/08/2015   MCV 100.8* 08/08/2015   PLT 217 08/08/2015     Recent Labs Lab 08/08/15 0207  NA 138  K 3.8  CL 105  CO2 22  BUN 22*  CREATININE 0.83  CALCIUM 7.0*  PROT 5.3*  BILITOT 0.9  ALKPHOS 54  ALT 8*  AST 17  GLUCOSE 162*   Lab Results  Component Value Date   CHOL  01/17/2010    180        ATP III CLASSIFICATION:  <200     mg/dL   Desirable  960-454  mg/dL   Borderline High  >=098    mg/dL   High          HDL 47 01/17/2010   LDLCALC * 01/17/2010    121        Total Cholesterol/HDL:CHD Risk Coronary Heart Disease Risk Table                     Men   Women  1/2 Average Risk   3.4   3.3  Average Risk       5.0   4.4  2 X Average Risk   9.6   7.1  3 X Average Risk  23.4   11.0        Use the calculated Patient Ratio above and the CHD Risk Table to determine the patient's CHD Risk.        ATP III CLASSIFICATION (LDL):  <100     mg/dL   Optimal  119-147  mg/dL   Near or Above                    Optimal  130-159  mg/dL   Borderline  829-562  mg/dL   High  >130     mg/dL   Very High   TRIG 60 86/57/8469     Radiology Studies    Dg Chest 2 View  08/08/2015  CLINICAL DATA:  82 year old female with rectal bleeding and atrial fibrillation EXAM: CHEST  2 VIEW COMPARISON:  Chest radiograph dated 01/16/2010 FINDINGS: Two  views of the chest do not demonstrate a focal  consolidation. There is no pleural effusion or pneumothorax. The cardiac silhouette is within normal limits. There is degenerative changes of the spine and shoulders. No acute osseous pathology. IMPRESSION: No active cardiopulmonary disease. Electronically Signed   By: Elgie CollardArash  Radparvar M.D.   On: 08/08/2015 02:39    ECG & Cardiac Imaging    EKG: 08/08/2015  A-Fib Rate-132 T wave abnormalities noted to ant/lat leads.  ECHO: 01/17/2010   Study Conclusions  - Left ventricle: The cavity size was normal. Wall thickness was  increased in a pattern of mild LVH. The estimated ejection  fraction was 65%. Wall motion was normal; there were no regional  wall motion abnormalities. - Aortic valve: Sclerosis without stenosis. - Mitral valve: Mildly calcified annulus. No significant  regurgitation. - Left atrium: The atrium was mildly dilated.   Assessment & Plan    1. A-Fib RVR: Pt presented to the ED with rectal bleeding since 8pm last night, with 5 current episodes. Noted be anemic, at 7.9, with concurrent a-fib RVR en route to the ED via EMS. Was given one dose of 10mg  IV Cardizem, but then became hypotensive. In the ED she was started in IV fluids, along with 1/3 unit PRBCs. She does have a hx of a-fib, and home dose of lopressor is currently 50mg  daily. States she is not on any anticoagulation and will not want to be in the future.  --This patients CHA2DS2-VASc Score and unadjusted Ischemic Stroke Rate (% per year) is equal to 4.8 % stroke rate/year from a score of 4 Above score calculated as 1 point each if present [CHF, HTN, DM, Vascular=MI/PAD/Aortic Plaque, Age if 65-74, or Female] Above score calculated as 2 points each if present [Age > 75, or Stroke/TIA/TE] -- She is not currently rate controlled. Given her reported episode of bradycardia in the ED, I would not give IV Lopressor. She did not tolerate the IV Cardizem via EMS, so  consider starting her on Amiodarone load and infusion. This is give her a chance to convert with IV therapy. --If she does not convert, would consider a TEE/CV once labs are more stable, since she has not been on anticoagulation therapy.    2. Acute GI Bleed: Hgb 7.9 in the ED this morning. Currently receiving 3rd Unit of PRBCs. This is most likely the cause of concurrent A-fib.  -management per primary team.  -GI consulted  3. Hypothyroidism: Last TSH 01/17/2010 was 0.149.  --Current dose of levothyroxine in 50mg  IV.  --TSH ordered via primary team.    -Dr. Eden EmmsNishan to see.  Kristi CoffinSigned, Kristi Roberts, NP-C Pager [phone] 08/08/2015, 8:27 AM  Patient seen in nuclear medicine having RBC tagged scan for active bleeding Suspect she has chronic afib Rates will be hard to control with active bleeding and severe anemia. Currently on amiodarone drip Depending on her course can consider iv esmolol or addition of iv Digoxin.  She is euthyroid on replacement Update echo but EF has been normal in past SEM on exam and pale.    Charlton HawsPeter Lyrick Morales

## 2015-08-08 NOTE — Progress Notes (Signed)
CRITICAL VALUE ALERT  Critical value received:  Calcium 6.2  Date of notification:  08/08/2015  Time of notification:  14:10  Critical value read back:Yes.    Nurse who received alert:  Dayton BailiffMaggie Deirdre Gryder  MD notified (1st page):  Gherghe  Time of first page:  14:12

## 2015-08-08 NOTE — ED Provider Notes (Addendum)
CSN: [id number]     Arrival date & time 08/08/15  0138 History  By signing my name below, I, Kristi Morales, attest that this documentation has been prepared under the direction and in the presence of Gilda Crease, MD. Electronically Signed: Bethel Morales, ED Scribe. 08/08/2015. 3:24 AM   Chief Complaint  Patient presents with  . Atrial Fibrillation  . Rectal Bleeding    The history is provided by the patient. No language interpreter was used.   Kristi Morales is a 82 y.o. female with PMHx of a-fib who presents to the Emergency Department complaining of new  bright red rectal bleeding with onset last night. She has had 5 bloody stools at home. Associated symptoms include pallor, dizziness, intermittent lower abdominal pain ("like I need to have a bowel movement"), and nausea. Pt denies chest pain. She states that her metoprolol was recently doubled but she has had no other medication changes. She is not on a blood thinner.   Past Medical History  Diagnosis Date  . A-fib (HCC)   . Thyroid disease    History reviewed. No pertinent past surgical history. Family History  Problem Relation Age of Onset  . Cancer Neg Hx   . Diabetes Neg Hx   . Heart failure Neg Hx   . Hyperlipidemia Neg Hx    Social History  Substance Use Topics  . Smoking status: Never Smoker   . Smokeless tobacco: None  . Alcohol Use: Yes   OB History    No data available     Review of Systems  Gastrointestinal: Positive for nausea, abdominal pain, blood in stool and hematochezia. Negative for vomiting.  Skin: Positive for pallor.  Neurological: Positive for dizziness.  All other systems reviewed and are negative.  Allergies  Review of patient's allergies indicates no known allergies.  Home Medications   Prior to Admission medications   Medication Sig Start Date End Date Taking? Authorizing Provider  digoxin (LANOXIN) 0.125 MG tablet Take 125 mcg by mouth daily. 07/03/15  Yes Historical  Provider, MD  metoprolol tartrate (LOPRESSOR) 25 MG tablet Take 50 mg by mouth daily. 07/08/15  Yes Historical Provider, MD  SYNTHROID 100 MCG tablet Take 100 mcg by mouth daily. 07/15/15  Yes Historical Provider, MD   BP 159/132 mmHg  Pulse 44  Temp(Src) 97.5 F (36.4 C) (Oral)  Resp 21  Ht 5\' 1"  (1.549 m)  Wt 128 lb (58.06 kg)  BMI 24.20 kg/m2  SpO2 97% Physical Exam  Constitutional: She is oriented to person, place, and time. She appears well-developed and well-nourished. No distress.  HENT:  Head: Normocephalic and atraumatic.  Right Ear: Hearing normal.  Left Ear: Hearing normal.  Nose: Nose normal.  Mouth/Throat: Oropharynx is clear and moist and mucous membranes are normal.  Eyes: Conjunctivae and EOM are normal. Pupils are equal, round, and reactive to light.  Neck: Normal range of motion. Neck supple.  Cardiovascular: S1 normal and S2 normal.  An irregularly irregular rhythm present. Tachycardia present.  Exam reveals no gallop and no friction rub.   No murmur heard. Pulmonary/Chest: Effort normal and breath sounds normal. No respiratory distress. She exhibits no tenderness.  Abdominal: Soft. Normal appearance and bowel sounds are normal. There is no hepatosplenomegaly. There is no tenderness. There is no rebound, no guarding, no tenderness at McBurney's point and negative Murphy's sign. No hernia.  Genitourinary:  Gross blood on rectal exam   Musculoskeletal: Normal range of motion.  Neurological: She is alert and  oriented to person, place, and time. She has normal strength. No cranial nerve deficit or sensory deficit. Coordination normal. GCS eye subscore is 4. GCS verbal subscore is 5. GCS motor subscore is 6.  Skin: Skin is warm, dry and intact. No rash noted. No cyanosis. There is pallor.  Psychiatric: She has a normal mood and affect. Her speech is normal and behavior is normal. Thought content normal.  Nursing note and vitals reviewed.   ED Course  Procedures  (including critical care time) DIAGNOSTIC STUDIES: Oxygen Saturation is 97% on RA,  normal by my interpretation.    COORDINATION OF CARE: 1:59 AM Discussed treatment plan which includes lab work, CXR, EKG with pt at bedside and pt agreed to plan.  3:03 AM-Consult complete with Dr. Virgina OrganQureshi (Cardiology). Patient case explained and discussed. Call ended at 3:05 AM  3:20 AM-Consult complete with Dr. Christene Slatese Dios (Critical Care). Patient case explained and discussed. Call ended at 3:24 AM  CRITICAL CARE Performed by: Gilda CreasePOLLINA, Nahun Kronberg J.   Total critical care time: 35 minutes  Critical care time was exclusive of separately billable procedures and treating other patients.  Critical care was necessary to treat or prevent imminent or life-threatening deterioration.  Critical care was time spent personally by me on the following activities: development of treatment plan with patient and/or surrogate as well as nursing, discussions with consultants, evaluation of patient's response to treatment, examination of patient, obtaining history from patient or surrogate, ordering and performing treatments and interventions, ordering and review of laboratory studies, ordering and review of radiographic studies, pulse oximetry and re-evaluation of patient's condition.   Labs Review Labs Reviewed  CBC WITH DIFFERENTIAL/PLATELET - Abnormal; Notable for the following:    RBC 2.53 (*)    Hemoglobin 7.9 (*)    HCT 25.5 (*)    MCV 100.8 (*)    All other components within normal limits  COMPREHENSIVE METABOLIC PANEL - Abnormal; Notable for the following:    Glucose, Bld 162 (*)    BUN 22 (*)    Calcium 7.0 (*)    Total Protein 5.3 (*)    Albumin 3.0 (*)    ALT 8 (*)    All other components within normal limits  PROTIME-INR - Abnormal; Notable for the following:    Prothrombin Time 15.7 (*)    All other components within normal limits  POC OCCULT BLOOD, ED - Abnormal; Notable for the following:     Fecal Occult Bld POSITIVE (*)    All other components within normal limits  TROPONIN I  TYPE AND SCREEN  ABO/RH  PREPARE FRESH FROZEN PLASMA  PREPARE RBC (CROSSMATCH)    Imaging Review Dg Chest 2 View  08/08/2015  CLINICAL DATA:  82 year old female with rectal bleeding and atrial fibrillation EXAM: CHEST  2 VIEW COMPARISON:  Chest radiograph dated 01/16/2010 FINDINGS: Two views of the chest do not demonstrate a focal consolidation. There is no pleural effusion or pneumothorax. The cardiac silhouette is within normal limits. There is degenerative changes of the spine and shoulders. No acute osseous pathology. IMPRESSION: No active cardiopulmonary disease. Electronically Signed   By: Elgie CollardArash  Radparvar M.D.   On: 08/08/2015 02:39   I have personally reviewed and evaluated these images and lab results as part of my medical decision-making.   EKG Interpretation   Date/Time:  Tuesday Aug 08 2015 01:41:39 EDT Ventricular Rate:  150 PR Interval:    QRS Duration: 69 QT Interval:  306 QTC Calculation: 483 R Axis:  38 Text Interpretation:  Atrial fibrillation with rapid V-rate Repolarization  abnormality, prob rate related Confirmed by Hanaan Gancarz  MD, Bereket Gernert  [phone]) on 08/08/2015 2:32:00 AM      MDM   Final diagnoses:  Atrial fibrillation with RVR (HCC)  Lower GI bleed   Patient presents to the emergency department for evaluation of rectal bleeding. Patient reports that she has had 5 episodes of bright red blood per rectum today. She has not been experiencing any abdominal pain or rectal pain associated with symptoms. Denies vomiting and has not proceeded diarrhea. She does have history of atrial fibrillation but is not on blood thinners.  Patient brought from home by EMS. She was found to be in atrial fibrillation with rapid ventricular response of 190 bpm. Patient had taken oral Lopressor 25 mg and oral digoxin prior to their arrival. She had persistent tachycardia and therefore was  administered IV Cardizem 10 mg IV. She reportedly decreased down into the 120 range and blood pressure did not drop significantly. Her blood pressure at arrival to the ER was slightly hypertensive. Part 2 being able to administer any further Cardizem, however, she became hypotensive and more persistently tachycardic.   patient found to be anemic with a hemoglobin of 7.9. Suspect that her hemoglobin will equilibrate much lower than that. She was administered emergency release blood.   During the evaluation in the ER, she suddenly became unresponsive and was noted to have a heart rate down into the 30s. this raises question of possible tachybradycardia syndrome, although medication effect from the multiple AV nodal blocker she has taken tonight certainly could have caused this. After this event I discussed her care with Dr Virgina Organ, on-call for cardiology. He did not feel that the patient should be given any further rate control tonight. Recommends treating with IV fluids and blood to replenish her anemia and suspects that her heart rate will come down with this treatment alone.  I discussed her care with Dr. Christene Slates, nuchal care as well. They did not feel that the patient required admission to the ICU. He recommends admission to the hospitalist service in the step down unit.   CHADSVASc Score for Atrial Fibrillation Stroke Risk from [email or web address]  on 08/08/2015  RESULT SUMMARY: 4 points Stroke risk was 4.8% per year in >90,000 patients (the [place] Atrial Fibrillation Cohort Study) and 6.7% risk of stroke/TIA/systemic embolism.  One recommendation suggests a 0 score is "low" risk and may not require anticoagulation; a 1 score is "low-moderate" risk and should consider antiplatelet or anticoagulation, and score 2 or greater is "moderate-high" risk and should otherwise be an anticoagulation candidate. INPUTS: Age -> 2 = ?74 Sex -> 1 = Female Congestive heart failure history -> 0 = No Hypertension history  -> 1 = Yes Stroke/TIA/Thromboembolism history -> 0 = No Vascular disease history -> 0 = No Diabetes history -> 0 = No PATIENT NOT CANDIDATE FOR ANTICOAGULATION DUE TO ACTIVE GI BLEED  Addendum: Discussed CODE STATUS with patient. She reports that she does not want to be intubated or have chest compressions performed. Patient's CODE STATUS changed to DO NOT RESUSCITATE.  I personally performed the services described in this documentation, which was scribed in my presence. The recorded information has been reviewed and is accurate.    Gilda Crease, MD 08/08/15 6045  Gilda Crease, MD 08/08/15 [phone]

## 2015-08-08 NOTE — Consult Note (Signed)
[facility] CONSULT Reason for consult: G.I. bleeding Referring Physician: [facility] . PCP: Dr. Forde Dandy. Primary G.I.: none  Kristi Morales is an 82 y.o. female.  HPI: she has a medical history of atrial fibrillation and hypothyroidism. She reports that she does not have any problem with her bowels easily and usually has a bowel movement approximately every one or 2 days without severe problems with constipation. She's never had a colonoscopy but did consider having one last year and changed her mind. She denies the use of NSAIDs or anticoagulants. Yesterday she had a bowel movement was brown today had multiple bright red blood episodes and presented to the emergency room. She had no nausea vomiting or pain and no preceding diarrhea. She was in a fib and receive cardizem bupivacaine bradycardia. She was hypotensive and it was not entirely clear if this was from bleeding, a fib, bradycardia or a combination of all these things. She did receive 3 units of blood since her admission. Her initial hemoglobin was 7.9 and has come up to 8.4. She adamantly denies any abdominal pain, change in bowel habits or family history of colon cancer. She was seen by cardiology and given her poor response to cardizem it was recommended that she be treated with amiodarone. She obviously cannot be anticoagulated since she's bleeding. She currently remains in atrial fib. G.I. bleeding scan obtained on admission revealed no evidence of active bleeding.  Past Medical History  Diagnosis Date  . A-fib ([place])   . Thyroid disease   . HTN (hypertension)     History reviewed. No pertinent past surgical history.  Family History  Problem Relation Age of Onset  . Cancer Neg Hx   . Diabetes Neg Hx   . Heart failure Neg Hx   . Hyperlipidemia Neg Hx     Social History:  reports that she has never smoked. She does not have any smokeless tobacco history on file. She reports that she drinks alcohol. She reports  that she does not use illicit drugs.  Allergies: No Known Allergies  Medications; Prior to Admission medications   Medication Sig Start Date End Date Taking? Authorizing Provider  digoxin (LANOXIN) 0.125 MG tablet Take 125 mcg by mouth daily. 07/03/15  Yes Historical Provider, MD  metoprolol tartrate (LOPRESSOR) 25 MG tablet Take 50 mg by mouth daily. 07/08/15  Yes Historical Provider, MD  SYNTHROID 100 MCG tablet Take 100 mcg by mouth daily. 07/15/15  Yes Historical Provider, MD   . sodium chloride   Intravenous Once  . calcium gluconate  1 g Intravenous Once  . levothyroxine  50 mcg Intravenous Daily  . pantoprazole (PROTONIX) IV  40 mg Intravenous Q24H   PRN Meds acetaminophen **OR** acetaminophen, ondansetron **OR** ondansetron (ZOFRAN) IV Results for orders placed or performed during the hospital encounter of 08/08/15 (from the past 48 hour(s))  Type and screen [place]     Status: None (Preliminary result)   Collection Time: 08/08/15  2:07 AM  Result Value Ref Range   ABO/RH(D) O NEG    Antibody Screen NEG    Sample Expiration 08/11/2015    Unit Number [id number]    Blood Component Type RBC LR PHER2    Unit division 00    Status of Unit ISSUED    Transfusion Status OK TO TRANSFUSE    Crossmatch Result COMPATIBLE    Unit tag comment VERBAL ORDERS PER DR DR [facility]    Unit Number [id number]    Blood Component  Type RED CELLS,LR    Unit division 00    Status of Unit ISSUED    Transfusion Status OK TO TRANSFUSE    Crossmatch Result COMPATIBLE    Unit tag comment VERBAL ORDERS PER DR DR [facility]    Unit Number [id number]    Blood Component Type RED CELLS,LR    Unit division 00    Status of Unit ALLOCATED    Transfusion Status OK TO TRANSFUSE    Crossmatch Result Compatible    Unit Number [id number]    Blood Component Type RED CELLS,LR    Unit division 00    Status of Unit ISSUED    Transfusion Status OK TO TRANSFUSE    Crossmatch Result  Compatible   CBC with Differential/Platelet     Status: Abnormal   Collection Time: 08/08/15  2:07 AM  Result Value Ref Range   WBC 8.0 4.0 - 10.5 K/uL   RBC 2.53 (L) 3.87 - 5.11 MIL/uL   Hemoglobin 7.9 (L) 12.0 - 15.0 g/dL   HCT 25.5 (L) 36.0 - 46.0 %   MCV 100.8 (H) 78.0 - 100.0 fL   MCH 31.2 26.0 - 34.0 pg   MCHC 31.0 30.0 - 36.0 g/dL   RDW 14.5 11.5 - 15.5 %   Platelets 217 150 - 400 K/uL   Neutrophils Relative % 78 %   Neutro Abs 6.2 1.7 - 7.7 K/uL   Lymphocytes Relative 16 %   Lymphs Abs 1.2 0.7 - 4.0 K/uL   Monocytes Relative 5 %   Monocytes Absolute 0.4 0.1 - 1.0 K/uL   Eosinophils Relative 1 %   Eosinophils Absolute 0.1 0.0 - 0.7 K/uL   Basophils Relative 0 %   Basophils Absolute 0.0 0.0 - 0.1 K/uL  Comprehensive metabolic panel     Status: Abnormal   Collection Time: 08/08/15  2:07 AM  Result Value Ref Range   Sodium 138 135 - 145 mmol/L   Potassium 3.8 3.5 - 5.1 mmol/L   Chloride 105 101 - 111 mmol/L   CO2 22 22 - 32 mmol/L   Glucose, Bld 162 (H) 65 - 99 mg/dL   BUN 22 (H) 6 - 20 mg/dL   Creatinine, Ser 0.83 0.44 - 1.00 mg/dL   Calcium 7.0 (L) 8.9 - 10.3 mg/dL   Total Protein 5.3 (L) 6.5 - 8.1 g/dL   Albumin 3.0 (L) 3.5 - 5.0 g/dL   AST 17 15 - 41 U/L   ALT 8 (L) 14 - 54 U/L   Alkaline Phosphatase 54 38 - 126 U/L   Total Bilirubin 0.9 0.3 - 1.2 mg/dL   GFR calc non Af Amer >60 >60 mL/min   GFR calc Af Amer >60 >60 mL/min    Comment: (NOTE) The eGFR has been calculated using the CKD EPI equation. This calculation has not been validated in all clinical situations. eGFR's persistently <60 mL/min signify possible Chronic Kidney Disease.    Anion gap 11 5 - 15  Protime-INR     Status: Abnormal   Collection Time: 08/08/15  2:07 AM  Result Value Ref Range   Prothrombin Time 15.7 (H) 11.6 - 15.2 seconds   INR 1.23 0.00 - 1.49  Troponin I     Status: None   Collection Time: 08/08/15  2:07 AM  Result Value Ref Range   Troponin I <0.03 <0.031 ng/mL     Comment:        NO INDICATION OF MYOCARDIAL INJURY.   ABO/Rh  Status: None   Collection Time: 08/08/15  2:07 AM  Result Value Ref Range   ABO/RH(D) O NEG   Prepare RBC     Status: None   Collection Time: 08/08/15  2:07 AM  Result Value Ref Range   Order Confirmation ORDER PROCESSED BY BLOOD BANK   POC occult blood, ED     Status: Abnormal   Collection Time: 08/08/15  2:17 AM  Result Value Ref Range   Fecal Occult Bld POSITIVE (A) NEGATIVE  Prepare fresh frozen plasma     Status: None   Collection Time: 08/08/15  2:56 AM  Result Value Ref Range   Unit Number [id number]    Blood Component Type THAWED PLASMA    Unit division 00    Status of Unit REL FROM [facility]    Transfusion Status OK TO TRANSFUSE    Unit tag comment VERBAL ORDERS PER DR DR Waverly Ferrari    Unit Number [id number]    Blood Component Type THWPLS APHR1    Unit division 00    Status of Unit REL FROM [facility]    Transfusion Status OK TO TRANSFUSE    Unit tag comment VERBAL ORDERS PER DR DR Waverly Ferrari   MRSA PCR Screening     Status: None   Collection Time: 08/08/15  5:50 AM  Result Value Ref Range   MRSA by PCR NEGATIVE NEGATIVE    Comment:        The GeneXpert MRSA Assay (FDA approved for NASAL specimens only), is one component of a comprehensive MRSA colonization surveillance program. It is not intended to diagnose MRSA infection nor to guide or monitor treatment for MRSA infections.   Prepare RBC     Status: None   Collection Time: 08/08/15  6:37 AM  Result Value Ref Range   Order Confirmation ORDER PROCESSED BY BLOOD BANK   Prepare RBC     Status: None   Collection Time: 08/08/15  6:51 AM  Result Value Ref Range   Order Confirmation ORDER PROCESSED BY BLOOD BANK   Glucose, capillary     Status: Abnormal   Collection Time: 08/08/15  8:05 AM  Result Value Ref Range   Glucose-Capillary 109 (H) 65 - 99 mg/dL   Comment 1 Notify RN    Comment 2 Document in Chart   TSH     Status: Abnormal   Collection  Time: 08/08/15 12:56 PM  Result Value Ref Range   TSH 0.130 (L) 0.350 - 4.500 uIU/mL  Digoxin level     Status: None   Collection Time: 08/08/15 12:56 PM  Result Value Ref Range   Digoxin Level 0.9 0.8 - 2.0 ng/mL  Lactic acid, plasma     Status: None   Collection Time: 08/08/15 12:56 PM  Result Value Ref Range   Lactic Acid, Venous 1.0 0.5 - 2.0 mmol/L  Comprehensive metabolic panel     Status: Abnormal   Collection Time: 08/08/15 12:56 PM  Result Value Ref Range   Sodium 141 135 - 145 mmol/L   Potassium 3.8 3.5 - 5.1 mmol/L   Chloride 113 (H) 101 - 111 mmol/L   CO2 19 (L) 22 - 32 mmol/L   Glucose, Bld 139 (H) 65 - 99 mg/dL   BUN 14 6 - 20 mg/dL   Creatinine, Ser 0.63 0.44 - 1.00 mg/dL   Calcium 6.2 (LL) 8.9 - 10.3 mg/dL    Comment: CRITICAL RESULT CALLED TO, READ BACK BY AND VERIFIED WITH: M.CHRISMON,RN 1408 08/08/15 CLARK,S  Total Protein 4.2 (L) 6.5 - 8.1 g/dL   Albumin 2.2 (L) 3.5 - 5.0 g/dL   AST 14 (L) 15 - 41 U/L   ALT 7 (L) 14 - 54 U/L   Alkaline Phosphatase 34 (L) 38 - 126 U/L   Total Bilirubin 1.0 0.3 - 1.2 mg/dL   GFR calc non Af Amer >60 >60 mL/min   GFR calc Af Amer >60 >60 mL/min    Comment: (NOTE) The eGFR has been calculated using the CKD EPI equation. This calculation has not been validated in all clinical situations. eGFR's persistently <60 mL/min signify possible Chronic Kidney Disease.    Anion gap 9 5 - 15  Troponin I     Status: None   Collection Time: 08/08/15 12:56 PM  Result Value Ref Range   Troponin I <0.03 <0.031 ng/mL    Comment:        NO INDICATION OF MYOCARDIAL INJURY.   CBC     Status: Abnormal   Collection Time: 08/08/15 12:56 PM  Result Value Ref Range   WBC 6.9 4.0 - 10.5 K/uL   RBC 2.84 (L) 3.87 - 5.11 MIL/uL   Hemoglobin 8.4 (L) 12.0 - 15.0 g/dL    Comment: REPEATED TO VERIFY POST TRANSFUSION SPECIMEN    HCT 25.5 (L) 36.0 - 46.0 %   MCV 89.8 78.0 - 100.0 fL    Comment: POST TRANSFUSION SPECIMEN   MCH 29.6 26.0 - 34.0  pg   MCHC 32.9 30.0 - 36.0 g/dL   RDW 17.2 (H) 11.5 - 15.5 %   Platelets 134 (L) 150 - 400 K/uL    Dg Chest 2 View  08/08/2015  CLINICAL DATA:  87-year-old female with rectal bleeding and atrial fibrillation EXAM: CHEST  2 VIEW COMPARISON:  Chest radiograph dated 01/16/2010 FINDINGS: Two views of the chest do not demonstrate a focal consolidation. There is no pleural effusion or pneumothorax. The cardiac silhouette is within normal limits. There is degenerative changes of the spine and shoulders. No acute osseous pathology. IMPRESSION: No active cardiopulmonary disease. Electronically Signed   By: Arash  Radparvar M.D.   On: 08/08/2015 02:39   Nm Gi Blood Loss  08/08/2015  CLINICAL DATA:  GI bleeding.  Alcoholic gastritis. EXAM: NUCLEAR MEDICINE GASTROINTESTINAL BLEEDING SCAN TECHNIQUE: Sequential abdominal images were obtained following intravenous administration of Tc-99m labeled red blood cells. RADIOPHARMACEUTICALS:  26.1 mCi Tc-99m in-vitro labeled red cells. COMPARISON:  None. FINDINGS: Negative for active GI hemorrhage. Activity in the left upper quadrant does not change and is attributed to the spleen. Normal biodistribution with progressive bladder filling. IMPRESSION: Negative for active GI bleeding. Electronically Signed   By: Jonathon  Watts M.D.   On: 08/08/2015 13:15               Blood pressure 103/46, pulse 112, temperature 97.6 F (36.4 C), temperature source Oral, resp. rate 15, height 5' (1.524 m), weight 54.2 kg (119 lb 7.8 oz), SpO2 96 %.  Physical exam:   General--Pleasant white female ENT-- nonicteric Neck-- lymphadenopathy Heart-- somewhat tachycardic Lungs-- clear Abdomen -- soft and nontender  Psych-- alert and oriented Assessment: 1. Lower G.I. bleeding. This is probably diverticular. The patient has never had a colonoscopy. The bleeding scan is negative and it appears that she has stopped. I have suggested to her that she consider a colonoscopy this  admission after her cardiac function stabilizes 2. Atrial fibrillation with RVR. She's been quite symptomatic with this and is currently being treated with amiodarone    Plan: 1. Will start him clear liquids give some Miralax to try to clean out her colon. 2. We may perform a colonoscopy for cardiac status stabilizes and she is agreeable. Will follow with you.   Khadijah Mastrianni JR,Alany Borman L 08/08/2015, 3:58 PM   This note was created using voice recognition software and minor errors may Have occurred unintentionally. Pager: [phone] If no answer or after hours call [phone]

## 2015-08-08 NOTE — ED Notes (Signed)
Pt brought to ED by [facility] from home for rectal bleed, pt states she had 5 episode of bloody stool today, denies any pain, nausea or vomiting, pt is not on blood thinners.  A fib on monitor with HR on 190's on EMS arrival to her house, pt had a Digoxin at home PT EMS arrival with no change on HR 10 mg Cardizem IV given by EMS HR changed to 90's, 300 mL NS bolus given for low BP after cardizem given. Pt looks very pale an states she feels very dizzy. VSS BP 128/90, HR100- 120, CBG 184.

## 2015-08-08 NOTE — H&P (Addendum)
History and Physical    Kristi Morales ZOX:[id number] DOB: 23-Jun-1927 DOA: 08/08/2015  Referring MD/NP/PA: Dr.Pollino. PCP: Julian Hy, MD  Outpatient Specialists: Dr.Kline. Cardiology. Kristi Morales coming from: Home.  Chief Complaint: Rectal bleeding.  HPI: Kristi Morales is a 82 y.o. female with medical history significant of atrial fibrillation and hypothyroidism presents to the ER because of rectal bleeding. Kristi Morales started having rectal bleeding since last evening around 9 PM. Denies any abdominal pain nausea vomiting or diarrhea. Kristi Morales takes baby aspirin. Denies any use of NSAIDs. Kristi Morales had some dizziness and feeling weak. EMS was called and Kristi Morales was in A. fib with RVR for which Kristi Morales was given initially metoprolol orally followed by Cardizem IV bolus. In the ER Kristi Morales also was in A. fib with RVR following which Cardizem infusion was started but Kristi Morales became significant be bradycardic in the 30s. Kristi Morales was found to be having active bleeding site was hypotensive and was given fluid bolus and started on PRBC transfusion and Kristi Morales has just finished receiving the second and by the time I examined. Kristi Morales denies any chest pain or shortness of breath. I have already discussed with on-call gastroenterologist Dr. Randa Evens who will be seeing Kristi Morales in consult and has requested bleeding scan.   ED Course: IV fluids and PRBC transfusion. Cardiology and critical care were consulted by ER physician.  Review of Systems: As per HPI otherwise 10 point review of systems negative.    Past Medical History  Diagnosis Date  . A-fib ([facility])   . Thyroid disease     History reviewed. No pertinent past surgical history.   reports that she has never smoked. She does not have any smokeless tobacco history on file. She reports that she drinks alcohol. She reports that she does not use illicit drugs.  No Known Allergies  Family History  Problem Relation Age of Onset  . Cancer Neg Hx    . Diabetes Neg Hx   . Heart failure Neg Hx   . Hyperlipidemia Neg Hx     Prior to Admission medications   Medication Sig Start Date End Date Taking? Authorizing Provider  digoxin (LANOXIN) 0.125 MG tablet Take 125 mcg by mouth daily. 07/03/15  Yes Historical Provider, MD  metoprolol tartrate (LOPRESSOR) 25 MG tablet Take 50 mg by mouth daily. 07/08/15  Yes Historical Provider, MD  SYNTHROID 100 MCG tablet Take 100 mcg by mouth daily. 07/15/15  Yes Historical Provider, MD    Physical Exam: Filed Vitals:   08/08/15 0615 08/08/15 0617 08/08/15 0620 08/08/15 0630  BP: 73/41 75/46 100/64 119/59  Pulse: 75 82 76 92  Temp:    97.6 F (36.4 C)  TempSrc:    Oral  Resp: 7 14 17 17   Height:      Weight:      SpO2: 99% 98% 98% 97%      Constitutional: Not in distress. Filed Vitals:   08/08/15 0615 08/08/15 0617 08/08/15 0620 08/08/15 0630  BP: 73/41 75/46 100/64 119/59  Pulse: 75 82 76 92  Temp:    97.6 F (36.4 C)  TempSrc:    Oral  Resp: 7 14 17 17   Height:      Weight:      SpO2: 99% 98% 98% 97%   Eyes: Anicteric pallor present. ENMT: No discharge from the ears eyes nose and mouth. Neck: No mass felt. No JVD appreciated. Respiratory: No rhonchi or crepitations. Cardiovascular: S1 and S2 heard. Abdomen: Soft nontender bowel sounds present. Musculoskeletal: No  edema. Skin: Appears pale. No rash. Neurologic: Alert awake oriented to time place and person. Moves all activities. Psychiatric: Appears normal.   Labs on Admission: I have personally reviewed following labs and imaging studies  CBC:  Recent Labs Lab 08/08/15 0207  WBC 8.0  NEUTROABS 6.2  HGB 7.9*  HCT 25.5*  MCV 100.8*  PLT 217   Basic Metabolic Panel:  Recent Labs Lab 08/08/15 0207  NA 138  K 3.8  CL 105  CO2 22  GLUCOSE 162*  BUN 22*  CREATININE 0.83  CALCIUM 7.0*   GFR: Estimated Creatinine Clearance: 35.6 mL/min (by C-G formula based on Cr of 0.83). Liver Function Tests:  Recent  Labs Lab 08/08/15 0207  AST 17  ALT 8*  ALKPHOS 54  BILITOT 0.9  PROT 5.3*  ALBUMIN 3.0*   No results for input(s): LIPASE, AMYLASE in the last 168 hours. No results for input(s): AMMONIA in the last 168 hours. Coagulation Profile:  Recent Labs Lab 08/08/15 0207  INR 1.23   Cardiac Enzymes:  Recent Labs Lab 08/08/15 0207  TROPONINI <0.03   BNP (last 3 results) No results for input(s): PROBNP in the last 8760 hours. HbA1C: No results for input(s): HGBA1C in the last 72 hours. CBG: No results for input(s): GLUCAP in the last 168 hours. Lipid Profile: No results for input(s): CHOL, HDL, LDLCALC, TRIG, CHOLHDL, LDLDIRECT in the last 72 hours. Thyroid Function Tests: No results for input(s): TSH, T4TOTAL, FREET4, T3FREE, THYROIDAB in the last 72 hours. Anemia Panel: No results for input(s): VITAMINB12, FOLATE, FERRITIN, TIBC, IRON, RETICCTPCT in the last 72 hours. Urine analysis:    Component Value Date/Time   COLORURINE YELLOW 01/16/2010 1600   APPEARANCEUR CLEAR 01/16/2010 1600   LABSPEC 1.013 01/16/2010 1600   PHURINE 5.5 01/16/2010 1600   GLUCOSEU NEGATIVE 01/16/2010 1600   HGBUR NEGATIVE 01/16/2010 1600   BILIRUBINUR NEGATIVE 01/16/2010 1600   KETONESUR 40* 01/16/2010 1600   PROTEINUR NEGATIVE 01/16/2010 1600   UROBILINOGEN 0.2 01/16/2010 1600   NITRITE NEGATIVE 01/16/2010 1600   LEUKOCYTESUR SMALL* 01/16/2010 1600   Sepsis Labs: @LABRCNTIP (procalcitonin:4,lacticidven:4) )No results found for this or any previous visit (from the past 240 hour(s)).   Radiological Exams on Admission: Dg Chest 2 View  08/08/2015  CLINICAL DATA:  82 year old female with rectal bleeding and atrial fibrillation EXAM: CHEST  2 VIEW COMPARISON:  Chest radiograph dated 01/16/2010 FINDINGS: Two views of the chest do not demonstrate a focal consolidation. There is no pleural effusion or pneumothorax. The cardiac silhouette is within normal limits. There is degenerative changes of the  spine and shoulders. No acute osseous pathology. IMPRESSION: No active cardiopulmonary disease. Electronically Signed   By: Elgie CollardArash  Radparvar M.D.   On: 08/08/2015 02:39    EKG: Independently reviewed. A. fib with RVR.  Assessment/Plan Principal Problem:   Acute GI bleeding Active Problems:   Atrial fibrillation with RVR ([facility])   Acute blood loss anemia   Hypothyroidism    #1. Acute GI bleeding - most likely lower GI bleed diverticular in origin. Kristi Morales never had any colonoscopy earlier. I have discussed with on-call gastroenterologist Dr. Randa EvensEdwards who will be seeing Kristi Morales in consult. Dr. Randa EvensEdwards has requested bleeding scan. Kristi Morales will be kept nothing by mouth. Kristi Morales is still having some mild oozing from the rectum. Another unit of packed red blood cell transfusion has been ordered. Continue with IV fluids. Follow CBC. IV Protonix. Discontinue aspirin for now. #2. A. fib with RVR - probably preceded by #1. Kristi Morales's  heart rate decreases significantly on starting rate limiting medications. At this time all rate limiting medications are on hold. I have consulted cardiology. Check TSH and digoxin level. Continue with IV fluids and PRBC transfusion. Kristi Morales probably is volume depleted.CHADS2VASC score 4 but Kristi Morales had previously declined anticoagulation. #3. Acute blood loss anemia - follow CBC. See #1. #4. Hypothyroidism - on IV Synthroid for now since Kristi Morales is nothing by mouth. Check TSH.   DVT prophylaxis: SCDs. Code Status: DO NOT INTUBATE.  Family Communication: Kristi Morales's granddaughter.  Disposition Plan: Home.  Consults called: Gastroenterologist Dr. Randa Evens. And cardiologist.  Admission status: Inpatient. Stepdown unit. Likely stay 3-4 days.    Eduard Clos MD [facility] Pager [phone].  If 7PM-7AM, please contact night-coverage [email or web address] Password [facility]  08/08/2015, 6:38 AM

## 2015-08-08 NOTE — Progress Notes (Signed)
PROGRESS NOTE  Kristi Morales ZOX:[id number] DOB: [birth year] DOA: 08/08/2015 PCP: Julian Hy, MD Outpatient Specialists:    LOS: 0 days   Brief Narrative: 82 y.o. female with medical history significant of atrial fibrillation and hypothyroidism presents to the ER because of rectal bleeding, was found to have significant anemia with Hb of 7.9 from normal prior values, as well as was in A fib with RVR. She was started initially on Cardizem with subsequent bradycardia into the 30s. GI and Cardiology were consulted on admission.    Assessment & Plan: Principal Problem:   Acute GI bleeding Active Problems:   Atrial fibrillation with RVR (HCC)   Acute blood loss anemia   Hypothyroidism   Acute GI bleeding  - BRBPR, appears to be a diverticular bleed - discussed with Dr. Kirtland Bouchard overnight, he consulted gastroenterology, Dr. Randa Evens to see patient.  - NPO, obtain bleeding scan - transfuse 3 U pRBC as she is still oozing bright red blood - serial CBCs - patient not on any blood thinners or OTC NSAIDs. Takes a baby aspirin at home.  - never had a colonoscopy  A fib with RVR - patient with history of PAF - patient's CHA2DS2-VASc Score for Stroke Risk is 4 - she was initially on Eliquis, then Xarelto, could not tolerate either and she is not on [facility] currently - became bradycardic in the ED with Cardizem, currently off - cardiology consulted, appreciate input - patient mentions that she recently saw her PCP Dr. Evlyn Kanner and her digoxin was increased due to elevated heart rate in his office. Digoxin level is currently pending  ABLA - transfuse to keep Hb > 8 for now  Hypothyroidism  - continue synthroid  - check TSH+   DVT prophylaxis: SCD Code Status: Partial Family Communication: d/w granddaughter Designer, multimedia (SDU RN at [employer]) bedside Disposition Plan: remain in SDU Barriers for discharge: GI bleed   Consultants:   GI  Cardiology   Procedures:   None    Antimicrobials:  None    Subjective: - no complaints this morning, denies chest pain / palpitations, no abdominal pain, nausea or vomiting. Denies lightheadedness or dizziness.  Objective: Filed Vitals:   08/08/15 0700 08/08/15 0715 08/08/15 0730 08/08/15 0800  BP: 117/50 132/100 107/46 126/71  Pulse: 86 127 126 96  Temp:   97.7 F (36.5 C) 97.8 F (36.6 C)  TempSrc:   Oral Oral  Resp: Height:      Weight:      SpO2: 98% 100% 100% 91%    Intake/Output Summary (Last 24 hours) at 08/08/15 0826 Last data filed at 08/08/15 0800  Gross per 24 hour  Intake 673.33 ml  Output      0 ml  Net 673.33 ml   Filed Weights   08/08/15 0148 08/08/15 0551  Weight: 58.06 kg (128 lb) 54.2 kg (119 lb 7.8 oz)    Examination: Constitutional: NAD, pleasant 82 yo F. Appears younger than stated age 79 Vitals:   08/08/15 0700 08/08/15 0715 08/08/15 0730 08/08/15 0800  BP: 117/50 132/100 107/46 126/71  Pulse: 86 127 126 96  Temp:   97.7 F (36.5 C) 97.8 F (36.6 C)  TempSrc:   Oral Oral  Resp: Height:      Weight:      SpO2: 98% 100% 100% 91%   Respiratory: clear to auscultation bilaterally, no wheezing, no crackles. Normal respiratory effort. No accessory muscle use.  Cardiovascular: Irregular, tachycardic,  no murmurs / rubs / gallops appreciated. No extremity edema. 2+ pedal pulses.  Abdomen: no tenderness. Bowel sounds positive.  Musculoskeletal: no clubbing / cyanosis.  Neurologic: non focal  Psychiatric: Normal judgment and insight. Alert and oriented x 3. Normal mood.    Data Reviewed: I have personally reviewed following labs and imaging studies  CBC:  Recent Labs Lab 08/08/15 0207  WBC 8.0  NEUTROABS 6.2  HGB 7.9*  HCT 25.5*  MCV 100.8*  PLT 217   Basic Metabolic Panel:  Recent Labs Lab 08/08/15 0207  NA 138  K 3.8  CL 105  CO2 22  GLUCOSE 162*  BUN 22*  CREATININE 0.83  CALCIUM 7.0*   GFR: Estimated Creatinine  Clearance: 35.6 mL/min (by C-G formula based on Cr of 0.83). Liver Function Tests:  Recent Labs Lab 08/08/15 0207  AST 17  ALT 8*  ALKPHOS 54  BILITOT 0.9  PROT 5.3*  ALBUMIN 3.0*   Coagulation Profile:  Recent Labs Lab 08/08/15 0207  INR 1.23   Cardiac Enzymes:  Recent Labs Lab 08/08/15 0207  TROPONINI <0.03   Urine analysis:    Component Value Date/Time   COLORURINE YELLOW 01/16/2010 1600   APPEARANCEUR CLEAR 01/16/2010 1600   LABSPEC 1.013 01/16/2010 1600   PHURINE 5.5 01/16/2010 1600   GLUCOSEU NEGATIVE 01/16/2010 1600   HGBUR NEGATIVE 01/16/2010 1600   BILIRUBINUR NEGATIVE 01/16/2010 1600   KETONESUR 40* 01/16/2010 1600   PROTEINUR NEGATIVE 01/16/2010 1600   UROBILINOGEN 0.2 01/16/2010 1600   NITRITE NEGATIVE 01/16/2010 1600   LEUKOCYTESUR SMALL* 01/16/2010 1600     Radiology Studies: Dg Chest 2 View  08/08/2015  CLINICAL DATA:  82 year old female with rectal bleeding and atrial fibrillation EXAM: CHEST  2 VIEW COMPARISON:  Chest radiograph dated 01/16/2010 FINDINGS: Two views of the chest do not demonstrate a focal consolidation. There is no pleural effusion or pneumothorax. The cardiac silhouette is within normal limits. There is degenerative changes of the spine and shoulders. No acute osseous pathology. IMPRESSION: No active cardiopulmonary disease. Electronically Signed   By: Elgie CollardArash  Radparvar M.D.   On: 08/08/2015 02:39     Scheduled Meds: . sodium chloride   Intravenous Once  . levothyroxine  50 mcg Intravenous Daily  . pantoprazole (PROTONIX) IV  40 mg Intravenous Q24H   Continuous Infusions: . sodium chloride 100 mL/hr at 08/08/15 0645     Pamella Pertostin Gherghe, MD, PhD [facility] Pager [phone]  If 7PM-7AM, please contact night-coverage [email or web address] Password [email or web address] 08/08/2015, 8:26 AM

## 2015-08-08 NOTE — Progress Notes (Signed)
Amiodarone Drug - Drug Interaction Consult Note  [age] YOF with hx of afib and hypothyroidism presented to the ED with rectal bleeding and afib RVR, now to start amiodarone infusion.  Recommendations: No drug interactions identified with current inpatient medications. Pt is on digoxin 0.125 mg daily at home, currently holding. If need to restarted, recommend 0.0625 mg while on amiodarone.   Amiodarone is metabolized by the cytochrome P450 system and therefore has the potential to cause many drug interactions. Amiodarone has an average plasma half-life of 50 days (range 20 to 100 days).   There is potential for drug interactions to occur several weeks or months after stopping treatment and the onset of drug interactions may be slow after initiating amiodarone.   []  Statins: Increased risk of myopathy. Simvastatin- restrict dose to 20mg  daily. Other statins: counsel patients to report any muscle pain or weakness immediately.  []  Anticoagulants: Amiodarone can increase anticoagulant effect. Consider warfarin dose reduction. Patients should be monitored closely and the dose of anticoagulant altered accordingly, remembering that amiodarone levels take several weeks to stabilize.  []  Antiepileptics: Amiodarone can increase plasma concentration of phenytoin, the dose should be reduced. Note that small changes in phenytoin dose can result in large changes in levels. Monitor patient and counsel on signs of toxicity.  []  Beta blockers: increased risk of bradycardia, AV block and myocardial depression. Sotalol - avoid concomitant use.  []   Calcium channel blockers (diltiazem and verapamil): increased risk of bradycardia, AV block and myocardial depression.  []   Cyclosporine: Amiodarone increases levels of cyclosporine. Reduced dose of cyclosporine is recommended.  []  Digoxin dose should be halved when amiodarone is started.  []  Diuretics: increased risk of cardiotoxicity if hypokalemia occurs.  []  Oral  hypoglycemic agents (glyburide, glipizide, glimepiride): increased risk of hypoglycemia. Patient's glucose levels should be monitored closely when initiating amiodarone therapy.   []  Drugs that prolong the QT interval:  Torsades de pointes risk may be increased with concurrent use - avoid if possible.  Monitor QTc, also keep magnesium/potassium WNL if concurrent therapy can't be avoided. Marland Kitchen. Antibiotics: e.g. fluoroquinolones, erythromycin. . Antiarrhythmics: e.g. quinidine, procainamide, disopyramide, sotalol. . Antipsychotics: e.g. phenothiazines, haloperidol.  . Lithium, tricyclic antidepressants, and methadone.  Thank You,  Riki RuskBell, Akshaj Besancon Wang  08/08/2015 9:31 AM

## 2015-08-09 DIAGNOSIS — E039 Hypothyroidism, unspecified: Secondary | ICD-10-CM

## 2015-08-09 LAB — CBC
HCT: 23.5 % — ABNORMAL LOW (ref 36.0–46.0)
HCT: 17.3 % — ABNORMAL LOW (ref 36.0–46.0)
Hemoglobin: 7.9 g/dL — ABNORMAL LOW (ref 12.0–15.0)
Hemoglobin: 5.7 g/dL — CL (ref 12.0–15.0)
MCH: 28.4 pg (ref 26.0–34.0)
MCH: 28.2 pg (ref 26.0–34.0)
MCHC: 33.6 g/dL (ref 30.0–36.0)
MCHC: 32.9 g/dL (ref 30.0–36.0)
MCV: 84.5 fL (ref 78.0–100.0)
MCV: 85.6 fL (ref 78.0–100.0)
PLATELETS: 118 10*3/uL — AB (ref 150–400)
Platelets: 141 10*3/uL — ABNORMAL LOW (ref 150–400)
RBC: 2.78 MIL/uL — AB (ref 3.87–5.11)
RBC: 2.02 MIL/uL — ABNORMAL LOW (ref 3.87–5.11)
RDW: 20.4 % — AB (ref 11.5–15.5)
RDW: 20.9 % — ABNORMAL HIGH (ref 11.5–15.5)
WBC: 10.4 10*3/uL (ref 4.0–10.5)
WBC: 11.6 10*3/uL — AB (ref 4.0–10.5)

## 2015-08-09 LAB — GLUCOSE, CAPILLARY
GLUCOSE-CAPILLARY: 116 mg/dL — AB (ref 65–99)
GLUCOSE-CAPILLARY: 137 mg/dL — AB (ref 65–99)
Glucose-Capillary: 117 mg/dL — ABNORMAL HIGH (ref 65–99)
Glucose-Capillary: 123 mg/dL — ABNORMAL HIGH (ref 65–99)
Glucose-Capillary: 131 mg/dL — ABNORMAL HIGH (ref 65–99)

## 2015-08-09 LAB — HEMOGLOBIN AND HEMATOCRIT, BLOOD
HEMATOCRIT: 24.7 % — AB (ref 36.0–46.0)
HEMOGLOBIN: 8 g/dL — AB (ref 12.0–15.0)

## 2015-08-09 LAB — PREPARE RBC (CROSSMATCH)

## 2015-08-09 MED ORDER — LEVOTHYROXINE SODIUM 100 MCG IV SOLR: 37.5000 ug | Freq: Every day | INTRAVENOUS | Status: DC

## 2015-08-09 MED ORDER — PEG 3350-KCL-NA BICARB-NACL 420 G PO SOLR
4000.0000 mL | Freq: Once | ORAL | Status: AC
  Administered 2015-08-09: 4000 mL via ORAL
  Filled 2015-08-09: qty 4000

## 2015-08-09 MED ORDER — SODIUM CHLORIDE 0.9 % IV SOLN
Freq: Once | INTRAVENOUS | Status: AC
  Administered 2015-08-09: 17:00:00 via INTRAVENOUS

## 2015-08-09 NOTE — Progress Notes (Signed)
Patient Name: Kristi Morales Date of Encounter: 08/09/2015  Hospital Problem List     Principal Problem:   Acute GI bleeding Active Problems:   Atrial fibrillation with RVR (HCC)   Acute blood loss anemia   Hypothyroidism    Subjective   Maintaining SR since around 2am. Still reports active rectal bleeding, and remains weak.   Inpatient Medications    . levothyroxine  50 mcg Intravenous Daily  . pantoprazole (PROTONIX) IV  40 mg Intravenous Q24H    Vital Signs    Filed Vitals:   08/09/15 0400 08/09/15 0700 08/09/15 0757 08/09/15 0800  BP: 151/69 129/71  136/59  Pulse: 93 88  87  Temp:   97.9 F (36.6 C)   TempSrc:   Oral   Resp: 22 18  25   Height:      Weight:      SpO2: 100% 99%  97%    Intake/Output Summary (Last 24 hours) at 08/09/15 0855 Last data filed at 08/09/15 0500  Gross per 24 hour  Intake 3715.66 ml  Output    250 ml  Net 3465.66 ml   Filed Weights   08/08/15 0148 08/08/15 0551  Weight: 128 lb (58.06 kg) 119 lb 7.8 oz (54.2 kg)    Physical Exam    General: Pleasant older female, NAD. Neuro: Alert and oriented X 3. Moves all extremities spontaneously. Psych: Normal affect. HEENT:  Normal  Neck: Supple without bruits or JVD. Lungs:  Resp regular and unlabored, CTA. Heart: RRR no s3, s4, or murmurs. Abdomen: Soft, non-tender, non-distended, BS + x 4.  Extremities: No clubbing, cyanosis or edema. DP/PT/Radials 2+ and equal bilaterally. Skin: Pale  Labs    CBC  Recent Labs  08/08/15 0207 08/08/15 1256 08/08/15 2159 08/09/15 0357  WBC 8.0 6.9 8.6  --   NEUTROABS 6.2  --   --   --   HGB 7.9* 8.4* 7.8* 8.0*  HCT 25.5* 25.5* 23.4* 24.7*  MCV 100.8* 89.8 89.7  --   PLT 217 134* 145*  --    Basic Metabolic Panel  Recent Labs  08/08/15 0207 08/08/15 1256  NA 138 141  K 3.8 3.8  CL 105 113*  CO2 22 19*  GLUCOSE 162* 139*  BUN 22* 14  CREATININE 0.83 0.63  CALCIUM 7.0* 6.2*   Liver Function Tests  Recent Labs  08/08/15 0207 08/08/15 1256  AST 17 14*  ALT 8* 7*  ALKPHOS 54 34*  BILITOT 0.9 1.0  PROT 5.3* 4.2*  ALBUMIN 3.0* 2.2*   Cardiac Enzymes  Recent Labs  08/08/15 0207 08/08/15 1256  TROPONINI <0.03 <0.03   Thyroid Function Tests  Recent Labs  08/08/15 1256  TSH 0.130*    Telemetry    SR Rate-80s since around 2am.   ECG    No morning EKG  Radiology    Dg Chest 2 View  08/08/2015  CLINICAL DATA:  82 year old female with rectal bleeding and atrial fibrillation EXAM: CHEST  2 VIEW COMPARISON:  Chest radiograph dated 01/16/2010 FINDINGS: Two views of the chest do not demonstrate a focal consolidation. There is no pleural effusion or pneumothorax. The cardiac silhouette is within normal limits. There is degenerative changes of the spine and shoulders. No acute osseous pathology. IMPRESSION: No active cardiopulmonary disease. Electronically Signed   By: Elgie CollardArash  Radparvar M.D.   On: 08/08/2015 02:39   Nm Gi Blood Loss  08/08/2015  CLINICAL DATA:  GI bleeding.  Alcoholic gastritis. EXAM: NUCLEAR MEDICINE GASTROINTESTINAL  BLEEDING SCAN TECHNIQUE: Sequential abdominal images were obtained following intravenous administration of Tc-61m labeled red blood cells. RADIOPHARMACEUTICALS:  26.1 mCi Tc-22m in-vitro labeled red cells. COMPARISON:  None. FINDINGS: Negative for active GI hemorrhage. Activity in the left upper quadrant does not change and is attributed to the spleen. Normal biodistribution with progressive bladder filling. IMPRESSION: Negative for active GI bleeding. Electronically Signed   By: Marnee Spring M.D.   On: 08/08/2015 13:15    Assessment & Plan    1. A-Fib RVR: Pt presented to the ED with rectal bleeding since 8pm 08/07/2015, with 5 current episodes. Noted be anemic, at 7.9, with concurrent a-fib RVR en route to the ED via EMS. Was given one dose of  IV Cardizem, but then became hypotensive. In the ED she was started in IV fluids, along with 1/3 unit PRBCs. She does  have a hx of a-fib, and home dose of lopressor is currently  daily. States she is not on any anticoagulation and does not want to be in the future.  --This patients CHA2DS2-VASc Score and unadjusted Ischemic Stroke Rate (% per year) is equal to 4.8 % stroke rate/year from a score of 4 Above score calculated as 1 point each if present [CHF, HTN, DM, Vascular=MI/PAD/Aortic Plaque, Age if 65-74, or Female] Above score calculated as 2 points each if present [Age > 75, or Stroke/TIA/TE] --She was started on IV amiodarone, and converted to SR around 0200am. Vitals now stable. --Will leave on IV amiodarone at this time. She is scheduled to have a colonoscopy this morning around 10am? Will consider changing over to PO once able to tolerate.   2. Acute GI Bleed: Hgb 8.0 this am from 7.9 yesterday. Has received at least 3 units of blood, but continues to have bright red rectal bleeding.  -GI saw patient yesterday and changed her to clear liquid diet with plans for possible colonoscopy?  3. Hypothyroidism: TSH 0.130  --Current dose of levothyroxine in  IV.  --defer management per primary    Signed, Laverda Page NP-C Pager [phone]   Agree with note by Laverda Page NP-C  Converted to NSR. On IV amio . S/P Tx 3 U PRBCs. Hbg stable. HR 90s. BP OK. Exam benign. For colonoscopy tomorrow with Dr. Randa Evens to eval bleeding source (presumably diverticular). Will convert iv to PO meds after that. Will follow wit hyou.  Runell Gess, M.D., FACP, [facility], [facility] [address] [place], [place]  [address]  [phone] 08/09/2015 12:04 PM

## 2015-08-09 NOTE — Progress Notes (Signed)
[facility]                                                                              Patient Demographics  Kristi Morales, is a 82 y.o. female, DOB - 02/18/1928, ZOX:[id number]  Admit date - 08/08/2015   Admitting Physician Eduard ClosArshad N Kakrakandy, MD  Outpatient Primary MD for the patient is Julian HySOUTH,STEPHEN ALAN, MD  Outpatient specialists:   LOS - 1  days    Chief Complaint  Patient presents with  . Atrial Fibrillation  . Rectal Bleeding       Brief summary  82 y.o. female with medical history significant of atrial fibrillation and hypothyroidism presents to the ER because of rectal bleeding, was found to have significant anemia with Hb of 7.9 from normal prior values, as well as was in A fib with RVR. She was started initially on Cardizem with subsequent bradycardia into the 30s. GI and Cardiology were consulted on admission.     Assessment & Plan    Principal Problem: Acute GI bleeding : BRBPR, appears to be a diverticular bleed - Multiple bloody bowel movements overnight, hemoglobin down to 7.8 overnight, received 1 unit of packed RBCs. - Recheck hemoglobin again 7.9 this morning. Follow serial CBC, has received 4 units of blood since admission. - Tagged RBC scan negative. - GI consulted, planning colonoscopy in a.m. - Not on any blood thinners or NSAIDs, only on baby aspirin at home    A fib with RVR:  patient with history of PAF - patient's CHA2DS2-VASc Score for Stroke Risk is 4 - she was initially on Eliquis, then Xarelto, could not tolerate either and she is not on [facility] currently -Cardiology consulted, placed on amiodarone drip as patient had become bradycardic in the ED with Cardizem currently off - Per cardiology, will change to oral amiodarone after the colonoscopy.  ABLA - transfuse to keep Hb > 8 for now  Hypothyroidism  - continue synthroid  - TSH is 0.13, too low, need to reduce Synthroid by 25% to 75mcg daily.   Code Status: Partial  code  DVT Prophylaxis:   SCD's   Family Communication: Discussed in detail with the patient, all imaging results, lab results explained to the patient, daughter and family member in the room  Disposition Plan:   Time Spent in minutes   25 minutes  Procedures:  Tagged RBC scan  Consultants:   GI Cardiology  Antimicrobials:    None   Medications  Scheduled Meds: . levothyroxine  50 mcg Intravenous Daily  . pantoprazole (PROTONIX) IV  40 mg Intravenous Q24H  . polyethylene glycol-electrolytes  4,000 mL Oral Once   Continuous Infusions: . amiodarone 30 mg/hr (08/09/15 0500)   PRN Meds:.acetaminophen **OR** acetaminophen, ondansetron **OR** ondansetron (ZOFRAN) IV   Antibiotics   Anti-infectives    None        Subjective:   Kristi Morales was seen and examined today. Patient denies dizziness, chest pain, shortness of breath, abdominal pain, N/V/D/C, new weakness, numbess, tingling. No acute events overnight.    Objective:   Filed Vitals:   08/09/15 0700 08/09/15 0757 08/09/15 0800 08/09/15 0915  BP:  129/71  136/59 137/63  Pulse: 88  87 94  Temp:  97.9 F (36.6 C)    TempSrc:  Oral    Resp: Height:      Weight:      SpO2: 99%  97% 100%    Intake/Output Summary (Last 24 hours) at 08/09/15 1026 Last data filed at 08/09/15 0500  Gross per 24 hour  Intake 3360.66 ml  Output    250 ml  Net 3110.66 ml     Wt Readings from Last 3 Encounters:  08/08/15 54.2 kg (119 lb 7.8 oz)  02/27/10 58.233 kg (128 lb 6.1 oz)     Exam  General: Alert and oriented x 3, NAD  HEENT:  PERRLA, EOMI, Anicteric Sclera, mucous membranes moist.   Neck: Supple, no JVD, no masses  Cardiovascular: S1 S2 auscultated, no rubs, murmurs or gallops. Regular rate and rhythm.  Respiratory: Clear to auscultation bilaterally, no wheezing, rales or rhonchi  Gastrointestinal: Soft, Mildly tender in the left lower quadrant, nondistended, + bowel sounds  Ext: no  cyanosis clubbing or edema  Neuro: AAOx3, Cr N's II- XII. Strength 5/5 upper and lower extremities bilaterally  Skin: No rashes  Psych: Normal affect and demeanor, alert and oriented x3    Data Reviewed:  I have personally reviewed following labs and imaging studies  Micro Results Recent Results (from the past 240 hour(s))  MRSA PCR Screening     Status: None   Collection Time: 08/08/15  5:50 AM  Result Value Ref Range Status   MRSA by PCR NEGATIVE NEGATIVE Final    Comment:        The GeneXpert MRSA Assay (FDA approved for NASAL specimens only), is one component of a comprehensive MRSA colonization surveillance program. It is not intended to diagnose MRSA infection nor to guide or monitor treatment for MRSA infections.     Radiology Reports Dg Chest 2 View  08/08/2015  CLINICAL DATA:  82 year old female with rectal bleeding and atrial fibrillation EXAM: CHEST  2 VIEW COMPARISON:  Chest radiograph dated 01/16/2010 FINDINGS: Two views of the chest do not demonstrate a focal consolidation. There is no pleural effusion or pneumothorax. The cardiac silhouette is within normal limits. There is degenerative changes of the spine and shoulders. No acute osseous pathology. IMPRESSION: No active cardiopulmonary disease. Electronically Signed   By: Elgie Collard M.D.   On: 08/08/2015 02:39   Nm Gi Blood Loss  08/08/2015  CLINICAL DATA:  GI bleeding.  Alcoholic gastritis. EXAM: NUCLEAR MEDICINE GASTROINTESTINAL BLEEDING SCAN TECHNIQUE: Sequential abdominal images were obtained following intravenous administration of Tc-75m labeled red blood cells. RADIOPHARMACEUTICALS:  26.1 mCi Tc-65m in-vitro labeled red cells. COMPARISON:  None. FINDINGS: Negative for active GI hemorrhage. Activity in the left upper quadrant does not change and is attributed to the spleen. Normal biodistribution with progressive bladder filling. IMPRESSION: Negative for active GI bleeding. Electronically Signed   By:  Marnee Spring M.D.   On: 08/08/2015 13:15    Lab Data:  CBC:  Recent Labs Lab 08/08/15 0207 08/08/15 1256 08/08/15 2159 08/09/15 0357 08/09/15 0733  WBC 8.0 6.9 8.6  --  10.4  NEUTROABS 6.2  --   --   --   --   HGB 7.9* 8.4* 7.8* 8.0* 7.9*  HCT 25.5* 25.5* 23.4* 24.7* 23.5*  MCV 100.8* 89.8 89.7  --  84.5  PLT 217 134* 145*  --  118*   Basic Metabolic Panel:  Recent Labs Lab  08/08/15 0207 08/08/15 1256  NA 138 141  K 3.8 3.8  CL 105 113*  CO2 22 19*  GLUCOSE 162* 139*  BUN 22* 14  CREATININE 0.83 0.63  CALCIUM 7.0* 6.2*   GFR: Estimated Creatinine Clearance: 36.9 mL/min (by C-G formula based on Cr of 0.63). Liver Function Tests:  Recent Labs Lab 08/08/15 0207 08/08/15 1256  AST 17 14*  ALT 8* 7*  ALKPHOS 54 34*  BILITOT 0.9 1.0  PROT 5.3* 4.2*  ALBUMIN 3.0* 2.2*   No results for input(s): LIPASE, AMYLASE in the last 168 hours. No results for input(s): AMMONIA in the last 168 hours. Coagulation Profile:  Recent Labs Lab 08/08/15 0207  INR 1.23   Cardiac Enzymes:  Recent Labs Lab 08/08/15 0207 08/08/15 1256  TROPONINI <0.03 <0.03   BNP (last 3 results) No results for input(s): PROBNP in the last 8760 hours. HbA1C: No results for input(s): HGBA1C in the last 72 hours. CBG:  Recent Labs Lab 08/08/15 0805 08/08/15 1854 08/08/15 2300 08/09/15 0353 08/09/15 0756  GLUCAP 109* 108* 123* 116* 117*   Lipid Profile: No results for input(s): CHOL, HDL, LDLCALC, TRIG, CHOLHDL, LDLDIRECT in the last 72 hours. Thyroid Function Tests:  Recent Labs  08/08/15 1256  TSH 0.130*   Anemia Panel: No results for input(s): VITAMINB12, FOLATE, FERRITIN, TIBC, IRON, RETICCTPCT in the last 72 hours. Urine analysis:    Component Value Date/Time   COLORURINE YELLOW 01/16/2010 1600   APPEARANCEUR CLEAR 01/16/2010 1600   LABSPEC 1.013 01/16/2010 1600   PHURINE 5.5 01/16/2010 1600   GLUCOSEU NEGATIVE 01/16/2010 1600   HGBUR NEGATIVE 01/16/2010  1600   BILIRUBINUR NEGATIVE 01/16/2010 1600   KETONESUR 40* 01/16/2010 1600   PROTEINUR NEGATIVE 01/16/2010 1600   UROBILINOGEN 0.2 01/16/2010 1600   NITRITE NEGATIVE 01/16/2010 1600   LEUKOCYTESUR SMALL* 01/16/2010 1600     Aalani Aikens M.D. [facility] 08/09/2015, 10:26 AM  Pager: [phone] Between 7am to 7pm - call Pager - [phone]  After 7pm go to [email or web address] - password [email or web address]  Call night coverage person covering after 7pm

## 2015-08-09 NOTE — Progress Notes (Signed)
@  [phone] Paged Cardiologist Fellow on-call (Dr. Shirlee LatchMclean) regarding pt's conversion to NSR and prolonged QTc (0.48-0.51).-->Page never returned.  Repaged with same result.  @[id number]  QTc remessured and 0.47. Will continue to monitor and convey to day team.

## 2015-08-09 NOTE — Progress Notes (Signed)
Pt continues to have bright red liquid stool with small blood clots, pt also having some nausea with dry heaving. IV zofran given. [name] RN

## 2015-08-09 NOTE — Progress Notes (Signed)
[facility] PROGRESS NOTE Subjective patient has continued to have bleeding. Her heart rate is better controlled with amiodarone and now is running around 90. At this point in time she has had 4 units of blood. Cardiology feels that she could go ahead with colonoscopy.  Objective: Vital signs in last 24 hours: Temp:  [97.6 F (36.4 C)-98.4 F (36.9 C)] 97.9 F (36.6 C) (05/10 0757) Pulse Rate:  [74-174] 94 (05/10 0915) Resp:  [0-27] 18 (05/10 0915) BP: (97-151)/(46-90) 137/63 mmHg (05/10 0915) SpO2:  [94 %-100 %] 100 % (05/10 0915) Last BM Date: 08/09/15  Intake/Output from previous day: 05/09 0701 - 05/10 0700 In: 3745.7 [P.O.:240; I.V.:2729.7; Blood:666; IV Piggyback:110] Out: 250 [Urine:250] Intake/Output this shift:    PE: General-- WF alert and oriented Heart-- tachycardic Lungs-- clear Abdomen-- soft and nontender  Lab Results:  Recent Labs  08/08/15 0207 08/08/15 1256 08/08/15 2159 08/09/15 0357 08/09/15 0733  WBC 8.0 6.9 8.6  --  10.4  HGB 7.9* 8.4* 7.8* 8.0* 7.9*  HCT 25.5* 25.5* 23.4* 24.7* 23.5*  PLT 217 134* 145*  --  118*   BMET  Recent Labs  08/08/15 0207 08/08/15 1256  NA 138 141  K 3.8 3.8  CL 105 113*  CO2 22 19*  CREATININE 0.83 0.63   LFT  Recent Labs  08/08/15 0207 08/08/15 1256  PROT 5.3* 4.2*  AST 17 14*  ALT 8* 7*  ALKPHOS 54 34*  BILITOT 0.9 1.0   PT/INR  Recent Labs  08/08/15 0207  LABPROT 15.7*  INR 1.23   PANCREAS No results for input(s): LIPASE in the last 72 hours.       Studies/Results: Dg Chest 2 View  08/08/2015  CLINICAL DATA:  82 year old female with rectal bleeding and atrial fibrillation EXAM: CHEST  2 VIEW COMPARISON:  Chest radiograph dated 01/16/2010 FINDINGS: Two views of the chest do not demonstrate a focal consolidation. There is no pleural effusion or pneumothorax. The cardiac silhouette is within normal limits. There is degenerative changes of the spine and shoulders. No acute  osseous pathology. IMPRESSION: No active cardiopulmonary disease. Electronically Signed   By: Elgie CollardArash  Radparvar M.D.   On: 08/08/2015 02:39   Nm Gi Blood Loss  08/08/2015  CLINICAL DATA:  GI bleeding.  Alcoholic gastritis. EXAM: NUCLEAR MEDICINE GASTROINTESTINAL BLEEDING SCAN TECHNIQUE: Sequential abdominal images were obtained following intravenous administration of Tc-3569m labeled red blood cells. RADIOPHARMACEUTICALS:  26.1 mCi Tc-1769m in-vitro labeled red cells. COMPARISON:  None. FINDINGS: Negative for active GI hemorrhage. Activity in the left upper quadrant does not change and is attributed to the spleen. Normal biodistribution with progressive bladder filling. IMPRESSION: Negative for active GI bleeding. Electronically Signed   By: Marnee SpringJonathon  Watts M.D.   On: 08/08/2015 13:15    Medications: I have reviewed the patient's current medications.  Assessment/Plan: 1. Lower G.I. bleed. This is suggested by bright red blood in normal BUN creatinine. Suspect diverticular and origin. She has been cleared for colonoscopy and I have discussed this with her. We will go ahead and start liquids and start a colonoscopy prep and plan on colonoscopy tomorrow at approximately 8 AM with propofol sedation.   [name],Kristi Morales 08/09/2015, 10:14 AM  This note was created using voice recognition software. Minor errors may Have occurred unintentionally.  Pager: [phone] If no answer or after hours call [phone]

## 2015-08-09 NOTE — Progress Notes (Signed)
CRITICAL VALUE ALERT  Critical value received:  Hgb 5.7   Date of notification:  01/09/16   Time of notification:  1600  Critical value read back:Yes.    Nurse who received alert:  Balinda QuailsJessica Miller, RN   MD notified (1st page):  Dr. Isidoro Donningai  Time of first page:  [phone]  Responding MD:  Dr. Isidoro Donningai  Time MD responded:  [phone]

## 2015-08-09 NOTE — Progress Notes (Signed)
Patient having large bright red bm's with clots, pt states she cannot control her movements, appears frustrated. Endo schedule checked, pt does not appear to be on schedule. MD notified. Will continue to monitor.

## 2015-08-10 ENCOUNTER — Inpatient Hospital Stay (HOSPITAL_COMMUNITY): Payer: Medicare Other | Admitting: Anesthesiology

## 2015-08-10 ENCOUNTER — Encounter (HOSPITAL_COMMUNITY)
Admission: EM | Disposition: A | Discharge status: Home / Self Care | Payer: Self-pay | Source: Home / Self Care | Attending: Internal Medicine

## 2015-08-10 ENCOUNTER — Encounter (HOSPITAL_COMMUNITY): Payer: Self-pay | Admitting: Anesthesiology

## 2015-08-10 HISTORY — PX: COLONOSCOPY: SHX5424

## 2015-08-10 LAB — HEPATIC FUNCTION PANEL
ALT: 8 U/L — AB (ref 14–54)
AST: 22 U/L (ref 15–41)
Albumin: 2.3 g/dL — ABNORMAL LOW (ref 3.5–5.0)
Alkaline Phosphatase: 41 U/L (ref 38–126)
BILIRUBIN DIRECT: 0.2 mg/dL (ref 0.1–0.5)
Indirect Bilirubin: 0.5 mg/dL (ref 0.3–0.9)
TOTAL PROTEIN: 4.3 g/dL — AB (ref 6.5–8.1)
Total Bilirubin: 0.7 mg/dL (ref 0.3–1.2)

## 2015-08-10 LAB — BASIC METABOLIC PANEL
Anion gap: 7 (ref 5–15)
BUN: 11 mg/dL (ref 6–20)
CALCIUM: 6.4 mg/dL — AB (ref 8.9–10.3)
CHLORIDE: 109 mmol/L (ref 101–111)
CO2: 22 mmol/L (ref 22–32)
Creatinine, Ser: 0.81 mg/dL (ref 0.44–1.00)
GFR calc non Af Amer: >60 mL/min (ref >60)
GLUCOSE: 123 mg/dL — AB (ref 65–99)
Potassium: 3.7 mmol/L (ref 3.5–5.1)
Sodium: 138 mmol/L (ref 135–145)

## 2015-08-10 LAB — HEMOGLOBIN AND HEMATOCRIT, BLOOD
HCT: 30.6 % — ABNORMAL LOW (ref 36.0–46.0)
HCT: 26.7 % — ABNORMAL LOW (ref 36.0–46.0)
HEMATOCRIT: 30.4 % — AB (ref 36.0–46.0)
HEMOGLOBIN: 10.4 g/dL — AB (ref 12.0–15.0)
Hemoglobin: 10.5 g/dL — ABNORMAL LOW (ref 12.0–15.0)
Hemoglobin: 9.6 g/dL — ABNORMAL LOW (ref 12.0–15.0)

## 2015-08-10 LAB — GLUCOSE, CAPILLARY
GLUCOSE-CAPILLARY: 118 mg/dL — AB (ref 65–99)
GLUCOSE-CAPILLARY: 129 mg/dL — AB (ref 65–99)

## 2015-08-10 SURGERY — COLONOSCOPY
Anesthesia: Monitor Anesthesia Care

## 2015-08-10 MED ORDER — LACTATED RINGERS IV SOLN
INTRAVENOUS | Status: DC | PRN
  Administered 2015-08-10: 1000 mL
  Administered 2015-08-10: 06:00:00 via INTRAVENOUS

## 2015-08-10 MED ORDER — PROPOFOL 10 MG/ML IV BOLUS
INTRAVENOUS | Status: DC | PRN
  Administered 2015-08-10: 10 mg via INTRAVENOUS
  Administered 2015-08-10: 15 mg via INTRAVENOUS

## 2015-08-10 MED ORDER — LEVOTHYROXINE SODIUM 75 MCG PO TABS
75.0000 ug | ORAL_TABLET | Freq: Every day | ORAL | Status: DC
  Administered 2015-08-11: 75 ug via ORAL
  Filled 2015-08-10: qty 1

## 2015-08-10 MED ORDER — AMIODARONE HCL 200 MG PO TABS
200.0000 mg | ORAL_TABLET | Freq: Two times a day (BID) | ORAL | Status: DC
  Administered 2015-08-10 – 2015-08-11 (×3): 200 mg via ORAL
  Filled 2015-08-10 (×3): qty 1

## 2015-08-10 MED ORDER — SODIUM CHLORIDE 0.9 % IV SOLN
1.0000 g | Freq: Once | INTRAVENOUS | Status: AC
  Administered 2015-08-10: 1 g via INTRAVENOUS
  Filled 2015-08-10: qty 10

## 2015-08-10 MED ORDER — PROPOFOL 500 MG/50ML IV EMUL
INTRAVENOUS | Status: DC | PRN
  Administered 2015-08-10: 70 ug/kg/min via INTRAVENOUS

## 2015-08-10 MED ORDER — SODIUM CHLORIDE 0.9 % IV SOLN: INTRAVENOUS | Status: DC

## 2015-08-10 MED ORDER — POLYETHYLENE GLYCOL 3350 17 G PO PACK
17.0000 g | PACK | Freq: Three times a day (TID) | ORAL | Status: DC
  Administered 2015-08-11: 17 g via ORAL
  Filled 2015-08-10: qty 1

## 2015-08-10 MED ORDER — PROCHLORPERAZINE EDISYLATE 5 MG/ML IJ SOLN
5.0000 mg | Freq: Once | INTRAMUSCULAR | Status: AC
  Administered 2015-08-10: 5 mg via INTRAVENOUS
  Filled 2015-08-10: qty 2

## 2015-08-10 NOTE — Progress Notes (Signed)
Patient given education materials regarding diverticulosis, including low & high fiber diets. Patient states no further questions at this time. Will reassess and give education if needed. Will continue to monitor.

## 2015-08-10 NOTE — Anesthesia Postprocedure Evaluation (Signed)
Anesthesia Post Note  Patient: Kristi Morales  Procedure(s) Performed: Procedure(s) (LRB): COLONOSCOPY (N/A)  Patient location during evaluation: PACU Anesthesia Type: MAC Level of consciousness: awake and alert Pain management: pain level controlled Vital Signs Assessment: post-procedure vital signs reviewed and stable Respiratory status: spontaneous breathing, nonlabored ventilation, respiratory function stable and patient connected to nasal cannula oxygen Cardiovascular status: stable and blood pressure returned to baseline Anesthetic complications: no    Last Vitals:  Filed Vitals:   08/10/15 0920 08/10/15 1100  BP: 117/47 132/55  Pulse: 73 80  Temp:    Resp: 25 25    Last Pain:  Filed Vitals:   08/10/15 1153  PainSc: 0-No pain                 Shelton SilvasKevin D Davell Beckstead

## 2015-08-10 NOTE — Op Note (Signed)
[facility] Patient Name: Kristi Morales Procedure Date : 08/10/2015 MRN: [id number] Attending MD: Tresea Mall , MD Date of Birth: 02/16/1928 CSN: [id number] Age: 82 Admit Type: Inpatient Procedure:                Colonoscopy Indications:              Hematochezia, Acute post hemorrhagic anemia Providers:                Fayrene Fearing L. Randa Evens, MD, Tomma Rakers, RN, Beryle Beams, Technician, Vinnie Langton, CRNA Referring MD:              Medicines:                Propofol total dose 130 mg IV Complications:            No immediate complications. Estimated Blood Loss:     Estimated blood loss: none. Procedure:                Pre-Anesthesia Assessment:                           - Prior to the procedure, a History and Physical                            was performed, and patient medications and                            allergies were reviewed. The patient's tolerance of                            previous anesthesia was also reviewed. The risks                            and benefits of the procedure and the sedation                            options and risks were discussed with the patient.                            All questions were answered, and informed consent                            was obtained. Prior Anticoagulants: The patient has                            taken no previous anticoagulant or antiplatelet                            agents. ASA Grade Assessment: III - A patient with                            severe systemic disease. After reviewing the risks  and benefits, the patient was deemed in                            satisfactory condition to undergo the procedure.                           After obtaining informed consent, the colonoscope                            was passed under direct vision. Throughout the                            procedure, the patient's blood pressure, pulse, and                          oxygen saturations were monitored continuously. The                            EC-[id number] ([id number] scope was introduced through                            the anus and advanced to the the cecum, identified                            by appendiceal orifice and ileocecal valve. The                            colonoscopy was performed without difficulty. The                            patient tolerated the procedure well. The quality                            of the bowel preparation was good. The ileocecal                            valve, appendiceal orifice, and rectum were                            photographed. Scope In: 8:32:50 AM Scope Out: 8:50:32 AM Scope Withdrawal Time: 0 hours 10 minutes 37 seconds  Total Procedure Duration: 0 hours 17 minutes 42 seconds  Findings:      The perianal and digital rectal examinations were normal.      Multiple small and large-mouthed diverticula were found in the entire       colon. no active bleeding throughout the entire colon.      Internal hemorrhoids were found during retroflexion. The hemorrhoids       were small and Grade I (internal hemorrhoids that do not prolapse). Impression:               - Diverticulosis in the entire examined colon.                           - Internal hemorrhoids.                           -  No specimens collected.                           - lower G.I. bleed. Appears to have stopped                            probably was due to diverticulosis. Moderate Sedation:      MAC by anesthesia Recommendation:           - Resume regular diet today.                           - Continue present medications.                           - No repeat colonoscopy due to age.                           - Return patient to hospital ward for ongoing care. Procedure Code(s):        --- Professional ---                           [phone], Colonoscopy, flexible; diagnostic, including                             collection of specimen(s) by brushing or washing,                            when performed (separate procedure) Diagnosis Code(s):        --- Professional ---                           K92.1, Melena (includes Hematochezia)                           K57.30, Diverticulosis of large intestine without                            perforation or abscess without bleeding                           D62, Acute posthemorrhagic anemia CPT copyright 2016 [employer]. All rights reserved. The codes documented in this report are preliminary and upon coder review may  be revised to meet current compliance requirements. Carman Ching, MD Tresea Mall, MD 08/10/2015 9:22:05 AM This report has been signed electronically. Number of Addenda: 0

## 2015-08-10 NOTE — Progress Notes (Signed)
Subjective: Pt deneis CP  No SOB   Objective: Filed Vitals:   08/10/15 0900 08/10/15 0910 08/10/15 0920 08/10/15 1100  BP: 107/36 118/65 117/47 132/55  Pulse: 146 87 73 80  Temp:      TempSrc:      Resp: Height:      Weight:      SpO2: 98% 94% 93% 95%   Weight change:   Intake/Output Summary (Last 24 hours) at 08/10/15 1406 Last data filed at 08/10/15 0903  Gross per 24 hour  Intake 7895.5 ml  Output     55 ml  Net 7840.5 ml    General: Alert, awake, oriented x3, in no acute distress Neck:  JVP is normal Heart: Regular rate and rhythm, without murmurs, rubs, gallops.  Lungs: Clear to auscultation.  No rales or wheezes. Exemities:  No edema.   Neuro: Grossly intact, nonfocal.  Tele:  SR    Lab Results: Results for orders placed or performed during the hospital encounter of 08/08/15 (from the past 24 hour(s))  CBC     Status: Abnormal   Collection Time: 08/09/15  3:02 PM  Result Value Ref Range   WBC 11.6 (H) 4.0 - 10.5 K/uL   RBC 2.02 (L) 3.87 - 5.11 MIL/uL   Hemoglobin 5.7 (LL) 12.0 - 15.0 g/dL   HCT 16.1 (L) 09.6 - 04.5 %   MCV 85.6 78.0 - 100.0 fL   MCH 28.2 26.0 - 34.0 pg   MCHC 32.9 30.0 - 36.0 g/dL   RDW 40.9 (H) 81.1 - 91.4 %   Platelets 141 (L) 150 - 400 K/uL  Glucose, capillary     Status: Abnormal   Collection Time: 08/09/15  3:02 PM  Result Value Ref Range   Glucose-Capillary 137 (H) 65 - 99 mg/dL   Comment 1 Notify RN    Comment 2 Document in Chart   Prepare RBC     Status: None   Collection Time: 08/09/15  4:26 PM  Result Value Ref Range   Order Confirmation ORDER PROCESSED BY BLOOD BANK   Glucose, capillary     Status: Abnormal   Collection Time: 08/09/15  7:41 PM  Result Value Ref Range   Glucose-Capillary 131 (H) 65 - 99 mg/dL  Glucose, capillary     Status: Abnormal   Collection Time: 08/09/15 11:51 PM  Result Value Ref Range   Glucose-Capillary 118 (H) 65 - 99 mg/dL  Glucose, capillary     Status: Abnormal   Collection Time: 08/10/15  3:43 AM  Result Value Ref Range   Glucose-Capillary 129 (H) 65 - 99 mg/dL  Hemoglobin and hematocrit, blood     Status: Abnormal   Collection Time: 08/10/15  4:35 AM  Result Value Ref Range   Hemoglobin 10.4 (L) 12.0 - 15.0 g/dL   HCT 78.2 (L) 95.6 - 21.3 %  Basic metabolic panel     Status: Abnormal   Collection Time: 08/10/15  4:35 AM  Result Value Ref Range   Sodium 138 135 - 145 mmol/L   Potassium 3.7 3.5 - 5.1 mmol/L   Chloride 109 101 - 111 mmol/L   CO2 22 22 - 32 mmol/L   Glucose, Bld 123 (H) 65 - 99 mg/dL   BUN 11 6 - 20 mg/dL   Creatinine, Ser 0.86 0.44 - 1.00 mg/dL   Calcium 6.4 (LL) 8.9 - 10.3 mg/dL   GFR calc non Af Amer >60 >60 mL/min   GFR calc  Af Amer >60 >60 mL/min   Anion gap 7 5 - 15    Studies/Results: No results found.  Medications: REviewed    @PROBHOSP @  1  PAF  Pt remains in SR  On amiodarone alone  WIll transition to PO She has a history of afib   Had been on dig and metoprolol  Last seen by Odessa FlemingS Klein in 2011 I would recomm continuing amio for now and at D/C   Pt cannot be on anticoag now with recent bleeding ? If ever safe I would  Have her follow up in clinic soon with EP service WIll ned to follow heart rate and rhythm  Hgb  Thyroid function  Keep on 400 for now.   200 bid   I have made f/u appt in our clinc with  Francis Dowseenee Ursuy NP  ON May 30 at 10:30 AM   [address]  [address]    LOS: 2 days   Dietrich Patesaula Ross 08/10/2015, 2:06 PM

## 2015-08-10 NOTE — H&P (View-Only) (Signed)
[facility] CONSULT Reason for consult: G.I. bleeding Referring Physician: [facility] . PCP: Dr. Forde Dandy. Primary G.I.: none  Kristi Morales is an 82 y.o. female.  HPI: she has a medical history of atrial fibrillation and hypothyroidism. She reports that she does not have any problem with her bowels easily and usually has a bowel movement approximately every one or 2 days without severe problems with constipation. She's never had a colonoscopy but did consider having one last year and changed her mind. She denies the use of NSAIDs or anticoagulants. Yesterday she had a bowel movement was brown today had multiple bright red blood episodes and presented to the emergency room. She had no nausea vomiting or pain and no preceding diarrhea. She was in a fib and receive cardizem bupivacaine bradycardia. She was hypotensive and it was not entirely clear if this was from bleeding, a fib, bradycardia or a combination of all these things. She did receive 3 units of blood since her admission. Her initial hemoglobin was 7.9 and has come up to 8.4. She adamantly denies any abdominal pain, change in bowel habits or family history of colon cancer. She was seen by cardiology and given her poor response to cardizem it was recommended that she be treated with amiodarone. She obviously cannot be anticoagulated since she's bleeding. She currently remains in atrial fib. G.I. bleeding scan obtained on admission revealed no evidence of active bleeding.  Past Medical History  Diagnosis Date  . A-fib ([place])   . Thyroid disease   . HTN (hypertension)     History reviewed. No pertinent past surgical history.  Family History  Problem Relation Age of Onset  . Cancer Neg Hx   . Diabetes Neg Hx   . Heart failure Neg Hx   . Hyperlipidemia Neg Hx     Social History:  reports that she has never smoked. She does not have any smokeless tobacco history on file. She reports that she drinks alcohol. She reports  that she does not use illicit drugs.  Allergies: No Known Allergies  Medications; Prior to Admission medications   Medication Sig Start Date End Date Taking? Authorizing Provider  digoxin (LANOXIN) 0.125 MG tablet Take 125 mcg by mouth daily. 07/03/15  Yes Historical Provider, MD  metoprolol tartrate (LOPRESSOR) 25 MG tablet Take 50 mg by mouth daily. 07/08/15  Yes Historical Provider, MD  SYNTHROID 100 MCG tablet Take 100 mcg by mouth daily. 07/15/15  Yes Historical Provider, MD   . sodium chloride   Intravenous Once  . calcium gluconate  1 g Intravenous Once  . levothyroxine  50 mcg Intravenous Daily  . pantoprazole (PROTONIX) IV  40 mg Intravenous Q24H   PRN Meds acetaminophen **OR** acetaminophen, ondansetron **OR** ondansetron (ZOFRAN) IV Results for orders placed or performed during the hospital encounter of 08/08/15 (from the past 48 hour(s))  Type and screen [place]     Status: None (Preliminary result)   Collection Time: 08/08/15  2:07 AM  Result Value Ref Range   ABO/RH(D) O NEG    Antibody Screen NEG    Sample Expiration 08/11/2015    Unit Number [id number]    Blood Component Type RBC LR PHER2    Unit division 00    Status of Unit ISSUED    Transfusion Status OK TO TRANSFUSE    Crossmatch Result COMPATIBLE    Unit tag comment VERBAL ORDERS PER DR DR [facility]    Unit Number [id number]    Blood Component  Type RED CELLS,LR    Unit division 00    Status of Unit ISSUED    Transfusion Status OK TO TRANSFUSE    Crossmatch Result COMPATIBLE    Unit tag comment VERBAL ORDERS PER DR DR [facility]    Unit Number [id number]    Blood Component Type RED CELLS,LR    Unit division 00    Status of Unit ALLOCATED    Transfusion Status OK TO TRANSFUSE    Crossmatch Result Compatible    Unit Number [id number]    Blood Component Type RED CELLS,LR    Unit division 00    Status of Unit ISSUED    Transfusion Status OK TO TRANSFUSE    Crossmatch Result  Compatible   CBC with Differential/Platelet     Status: Abnormal   Collection Time: 08/08/15  2:07 AM  Result Value Ref Range   WBC 8.0 4.0 - 10.5 K/uL   RBC 2.53 (L) 3.87 - 5.11 MIL/uL   Hemoglobin 7.9 (L) 12.0 - 15.0 g/dL   HCT 25.5 (L) 36.0 - 46.0 %   MCV 100.8 (H) 78.0 - 100.0 fL   MCH 31.2 26.0 - 34.0 pg   MCHC 31.0 30.0 - 36.0 g/dL   RDW 14.5 11.5 - 15.5 %   Platelets 217 150 - 400 K/uL   Neutrophils Relative % 78 %   Neutro Abs 6.2 1.7 - 7.7 K/uL   Lymphocytes Relative 16 %   Lymphs Abs 1.2 0.7 - 4.0 K/uL   Monocytes Relative 5 %   Monocytes Absolute 0.4 0.1 - 1.0 K/uL   Eosinophils Relative 1 %   Eosinophils Absolute 0.1 0.0 - 0.7 K/uL   Basophils Relative 0 %   Basophils Absolute 0.0 0.0 - 0.1 K/uL  Comprehensive metabolic panel     Status: Abnormal   Collection Time: 08/08/15  2:07 AM  Result Value Ref Range   Sodium 138 135 - 145 mmol/L   Potassium 3.8 3.5 - 5.1 mmol/L   Chloride 105 101 - 111 mmol/L   CO2 22 22 - 32 mmol/L   Glucose, Bld 162 (H) 65 - 99 mg/dL   BUN 22 (H) 6 - 20 mg/dL   Creatinine, Ser 0.83 0.44 - 1.00 mg/dL   Calcium 7.0 (L) 8.9 - 10.3 mg/dL   Total Protein 5.3 (L) 6.5 - 8.1 g/dL   Albumin 3.0 (L) 3.5 - 5.0 g/dL   AST 17 15 - 41 U/L   ALT 8 (L) 14 - 54 U/L   Alkaline Phosphatase 54 38 - 126 U/L   Total Bilirubin 0.9 0.3 - 1.2 mg/dL   GFR calc non Af Amer >60 >60 mL/min   GFR calc Af Amer >60 >60 mL/min    Comment: (NOTE) The eGFR has been calculated using the CKD EPI equation. This calculation has not been validated in all clinical situations. eGFR's persistently <60 mL/min signify possible Chronic Kidney Disease.    Anion gap 11 5 - 15  Protime-INR     Status: Abnormal   Collection Time: 08/08/15  2:07 AM  Result Value Ref Range   Prothrombin Time 15.7 (H) 11.6 - 15.2 seconds   INR 1.23 0.00 - 1.49  Troponin I     Status: None   Collection Time: 08/08/15  2:07 AM  Result Value Ref Range   Troponin I <0.03 <0.031 ng/mL     Comment:        NO INDICATION OF MYOCARDIAL INJURY.   ABO/Rh  Status: None   Collection Time: 08/08/15  2:07 AM  Result Value Ref Range   ABO/RH(D) O NEG   Prepare RBC     Status: None   Collection Time: 08/08/15  2:07 AM  Result Value Ref Range   Order Confirmation ORDER PROCESSED BY BLOOD BANK   POC occult blood, ED     Status: Abnormal   Collection Time: 08/08/15  2:17 AM  Result Value Ref Range   Fecal Occult Bld POSITIVE (A) NEGATIVE  Prepare fresh frozen plasma     Status: None   Collection Time: 08/08/15  2:56 AM  Result Value Ref Range   Unit Number [id number]    Blood Component Type THAWED PLASMA    Unit division 00    Status of Unit REL FROM [facility]    Transfusion Status OK TO TRANSFUSE    Unit tag comment VERBAL ORDERS PER DR DR Waverly Ferrari    Unit Number [id number]    Blood Component Type THWPLS APHR1    Unit division 00    Status of Unit REL FROM [facility]    Transfusion Status OK TO TRANSFUSE    Unit tag comment VERBAL ORDERS PER DR DR Waverly Ferrari   MRSA PCR Screening     Status: None   Collection Time: 08/08/15  5:50 AM  Result Value Ref Range   MRSA by PCR NEGATIVE NEGATIVE    Comment:        The GeneXpert MRSA Assay (FDA approved for NASAL specimens only), is one component of a comprehensive MRSA colonization surveillance program. It is not intended to diagnose MRSA infection nor to guide or monitor treatment for MRSA infections.   Prepare RBC     Status: None   Collection Time: 08/08/15  6:37 AM  Result Value Ref Range   Order Confirmation ORDER PROCESSED BY BLOOD BANK   Prepare RBC     Status: None   Collection Time: 08/08/15  6:51 AM  Result Value Ref Range   Order Confirmation ORDER PROCESSED BY BLOOD BANK   Glucose, capillary     Status: Abnormal   Collection Time: 08/08/15  8:05 AM  Result Value Ref Range   Glucose-Capillary 109 (H) 65 - 99 mg/dL   Comment 1 Notify RN    Comment 2 Document in Chart   TSH     Status: Abnormal   Collection  Time: 08/08/15 12:56 PM  Result Value Ref Range   TSH 0.130 (L) 0.350 - 4.500 uIU/mL  Digoxin level     Status: None   Collection Time: 08/08/15 12:56 PM  Result Value Ref Range   Digoxin Level 0.9 0.8 - 2.0 ng/mL  Lactic acid, plasma     Status: None   Collection Time: 08/08/15 12:56 PM  Result Value Ref Range   Lactic Acid, Venous 1.0 0.5 - 2.0 mmol/L  Comprehensive metabolic panel     Status: Abnormal   Collection Time: 08/08/15 12:56 PM  Result Value Ref Range   Sodium 141 135 - 145 mmol/L   Potassium 3.8 3.5 - 5.1 mmol/L   Chloride 113 (H) 101 - 111 mmol/L   CO2 19 (L) 22 - 32 mmol/L   Glucose, Bld 139 (H) 65 - 99 mg/dL   BUN 14 6 - 20 mg/dL   Creatinine, Ser 0.63 0.44 - 1.00 mg/dL   Calcium 6.2 (LL) 8.9 - 10.3 mg/dL    Comment: CRITICAL RESULT CALLED TO, READ BACK BY AND VERIFIED WITH: M.CHRISMON,RN 1408 08/08/15 CLARK,S  Total Protein 4.2 (L) 6.5 - 8.1 g/dL   Albumin 2.2 (L) 3.5 - 5.0 g/dL   AST 14 (L) 15 - 41 U/L   ALT 7 (L) 14 - 54 U/L   Alkaline Phosphatase 34 (L) 38 - 126 U/L   Total Bilirubin 1.0 0.3 - 1.2 mg/dL   GFR calc non Af Amer >60 >60 mL/min   GFR calc Af Amer >60 >60 mL/min    Comment: (NOTE) The eGFR has been calculated using the CKD EPI equation. This calculation has not been validated in all clinical situations. eGFR's persistently <60 mL/min signify possible Chronic Kidney Disease.    Anion gap 9 5 - 15  Troponin I     Status: None   Collection Time: 08/08/15 12:56 PM  Result Value Ref Range   Troponin I <0.03 <0.031 ng/mL    Comment:        NO INDICATION OF MYOCARDIAL INJURY.   CBC     Status: Abnormal   Collection Time: 08/08/15 12:56 PM  Result Value Ref Range   WBC 6.9 4.0 - 10.5 K/uL   RBC 2.84 (L) 3.87 - 5.11 MIL/uL   Hemoglobin 8.4 (L) 12.0 - 15.0 g/dL    Comment: REPEATED TO VERIFY POST TRANSFUSION SPECIMEN    HCT 25.5 (L) 36.0 - 46.0 %   MCV 89.8 78.0 - 100.0 fL    Comment: POST TRANSFUSION SPECIMEN   MCH 29.6 26.0 - 34.0  pg   MCHC 32.9 30.0 - 36.0 g/dL   RDW 17.2 (H) 11.5 - 15.5 %   Platelets 134 (L) 150 - 400 K/uL    Dg Chest 2 View  08/08/2015  CLINICAL DATA:  82 year old female with rectal bleeding and atrial fibrillation EXAM: CHEST  2 VIEW COMPARISON:  Chest radiograph dated 01/16/2010 FINDINGS: Two views of the chest do not demonstrate a focal consolidation. There is no pleural effusion or pneumothorax. The cardiac silhouette is within normal limits. There is degenerative changes of the spine and shoulders. No acute osseous pathology. IMPRESSION: No active cardiopulmonary disease. Electronically Signed   By: Anner Crete M.D.   On: 08/08/2015 02:39   Nm Gi Blood Loss  08/08/2015  CLINICAL DATA:  GI bleeding.  Alcoholic gastritis. EXAM: NUCLEAR MEDICINE GASTROINTESTINAL BLEEDING SCAN TECHNIQUE: Sequential abdominal images were obtained following intravenous administration of Tc-89mlabeled red blood cells. RADIOPHARMACEUTICALS:  26.1 mCi Tc-930mn-vitro labeled red cells. COMPARISON:  None. FINDINGS: Negative for active GI hemorrhage. Activity in the left upper quadrant does not change and is attributed to the spleen. Normal biodistribution with progressive bladder filling. IMPRESSION: Negative for active GI bleeding. Electronically Signed   By: JoMonte Fantasia.D.   On: 08/08/2015 13:15               Blood pressure 103/46, pulse 112, temperature 97.6 F (36.4 C), temperature source Oral, resp. rate 15, height 5' (1.524 m), weight 54.2 kg (119 lb 7.8 oz), SpO2 96 %.  Physical exam:   General--Pleasant white female ENT-- nonicteric Neck-- lymphadenopathy Heart-- somewhat tachycardic Lungs-- clear Abdomen -- soft and nontender  Psych-- alert and oriented Assessment: 1. Lower G.I. bleeding. This is probably diverticular. The patient has never had a colonoscopy. The bleeding scan is negative and it appears that she has stopped. I have suggested to her that she consider a colonoscopy this  admission after her cardiac function stabilizes 2. Atrial fibrillation with RVR. She's been quite symptomatic with this and is currently being treated with amiodarone  Plan: 1. Will start him clear liquids give some Miralax to try to clean out her colon. 2. We may perform a colonoscopy for cardiac status stabilizes and she is agreeable. Will follow with you.   Herby Amick JR,Crimson Dubberly L 08/08/2015, 3:58 PM   This note was created using voice recognition software and minor errors may Have occurred unintentionally. Pager: [phone] If no answer or after hours call [phone]

## 2015-08-10 NOTE — Anesthesia Preprocedure Evaluation (Addendum)
Anesthesia Evaluation  Patient identified by MRN, date of birth, ID band Patient awake    Reviewed: Allergy & Precautions, NPO status , Patient's Chart, lab work & pertinent test results, reviewed documented beta blocker date and time   History of Anesthesia Complications Negative for: history of anesthetic complications  Airway Mallampati: II  TM Distance: <3 FB Neck ROM: Limited  Mouth opening: Limited Mouth Opening  Dental  (+) Caps, Dental Advisory Given,    Pulmonary neg pulmonary ROS,    breath sounds clear to auscultation       Cardiovascular hypertension, Pt. on home beta blockers and Pt. on medications  Rhythm:Irregular Rate:Normal     Neuro/Psych negative neurological ROS  negative psych ROS   GI/Hepatic negative GI ROS, Neg liver ROS,   Endo/Other  Hypothyroidism   Renal/GU negative Renal ROS  negative genitourinary   Musculoskeletal negative musculoskeletal ROS (+)   Abdominal   Peds negative pediatric ROS (+)  Hematology   Anesthesia Other Findings Right upper back partial  Reproductive/Obstetrics negative OB ROS                          Lab Results  Component Value Date   WBC 11.6* 08/09/2015   HGB 10.4* 08/10/2015   HCT 30.6* 08/10/2015   MCV 85.6 08/09/2015   PLT 141* 08/09/2015     Anesthesia Physical Anesthesia Plan  ASA: III  Anesthesia Plan: MAC   Post-op Pain Management:    Induction: Intravenous  Airway Management Planned: Simple Face Mask  Additional Equipment:   Intra-op Plan:   Post-operative Plan:   Informed Consent: I have reviewed the patients History and Physical, chart, labs and discussed the procedure including the risks, benefits and alternatives for the proposed anesthesia with the patient or authorized representative who has indicated his/her understanding and acceptance.     Plan Discussed with: CRNA  Anesthesia Plan Comments:          Anesthesia Quick Evaluation

## 2015-08-10 NOTE — Transfer of Care (Signed)
Immediate Anesthesia Transfer of Care Note  Patient: Kristi Morales  Procedure(s) Performed: Procedure(s): COLONOSCOPY (N/A)  Patient Location: Endoscopy Unit  Anesthesia Type:MAC  Level of Consciousness: awake, alert , oriented and patient cooperative  Airway & Oxygen Therapy: Patient Spontanous Breathing  Post-op Assessment: Report given to RN and Post -op Vital signs reviewed and stable  Post vital signs: Reviewed and stable  Last Vitals:  Filed Vitals:   08/10/15 0735 08/10/15 0900  BP: 155/46 107/36  Pulse:  146  Temp: 36.9 C   Resp: 30 17    Last Pain:  Filed Vitals:   08/10/15 0902  PainSc: 0-No pain         Complications: No apparent anesthesia complications

## 2015-08-10 NOTE — Progress Notes (Signed)
[facility]                                                                              Patient Demographics  Kristi Morales, is a 82 y.o. female, DOB - 1928/03/14, ZOX:[id number]  Admit date - 08/08/2015   Admitting Physician Eduard Clos, MD  Outpatient Primary MD for the patient is Julian Hy, MD  Outpatient specialists:   LOS - 2  days    Chief Complaint  Patient presents with  . Atrial Fibrillation  . Rectal Bleeding       Brief summary  82 y.o. female with medical history significant of atrial fibrillation and hypothyroidism presents to the ER because of rectal bleeding, was found to have significant anemia with Hb of 7.9 from normal prior values, as well as was in A fib with RVR. She was started initially on Cardizem with subsequent bradycardia into the 30s. GI and Cardiology were consulted on admission.     Assessment & Plan    Principal Problem: Acute GI bleeding : BRBPR, appears to be a diverticular bleed - Hemoglobin trended down to lowest 5.7 yesterday evening, requiring another 3 units. H&H is stable today, no further bleeding - Tagged RBC scan negative. - Underwent colonoscopy this a.m., has diverticulosis throughout the colon, no active bleeding - Not on any blood thinners or NSAIDs, only on baby aspirin at home  - restart her diet   A fib with RVR:  patient with history of PAF - patient's CHA2DS2-VASc Score for Stroke Risk is 4 - she was initially on Eliquis, then Xarelto, could not tolerate either and she is not on [facility] currently -Cardiology consulted, placed on amiodarone drip as patient had become bradycardic in the ED with Cardizem currently off - Per cardiology, will change to oral amiodarone after the colonoscopy.  ABLA - transfuse to keep Hb > 8 for now  Hypothyroidism  - continue synthroid  - TSH is 0.13, too low, need to reduce Synthroid by 25% to daily.   Hypocalcemia - Replaced, check  albumin  Code Status: Partial code  DVT Prophylaxis:   SCD's   Family Communication: Discussed in detail with the patient, all imaging results, lab results explained to the patient, granddaughter at the bedside  Disposition Plan:   Time Spent in minutes   15 minutes  Procedures:  Tagged RBC scan EGD  Consultants:   GI Cardiology  Antimicrobials:    None   Medications  Scheduled Meds: . levothyroxine  37.5 mcg Intravenous Daily  . pantoprazole (PROTONIX) IV  40 mg Intravenous Q24H  . polyethylene glycol  17 g Oral TID   Continuous Infusions: . amiodarone 30 mg/hr (08/10/15 0600)   PRN Meds:.ondansetron **OR** ondansetron (ZOFRAN) IV   Antibiotics   Anti-infectives    None        Subjective:   [name] was seen and examined today. Feels much better today, no bleeding this morning. Patient denies dizziness, chest pain, shortness of breath, abdominal pain, N/V/D/C, new weakness, numbess, tingling. No acute events overnight.    Objective:   Filed Vitals:   08/10/15 0735 08/10/15 0900 08/10/15 0910  08/10/15 0920  BP: 155/46 107/36 118/65 117/47  Pulse:  146 87 73  Temp: 98.5 F (36.9 C)     TempSrc: Oral     Resp: Height:      Weight:      SpO2: 93% 98% 94% 93%    Intake/Output Summary (Last 24 hours) at 08/10/15 1051 Last data filed at 08/10/15 0981  Gross per 24 hour  Intake 7895.5 ml  Output     55 ml  Net 7840.5 ml     Wt Readings from Last 3 Encounters:  08/08/15 54.2 kg (119 lb 7.8 oz)  02/27/10 58.233 kg (128 lb 6.1 oz)     Exam  General: Alert and oriented x 3, NAD  HEENT:    Neck:   Cardiovascular: S1 S2clear, RRR  Respiratory: Clear to auscultation bilaterally, no wheezing, rales or rhonchi  Gastrointestinal: Soft, NT, nondistended, + bowel sounds  Ext: no cyanosis clubbing or edema  Neuro: no new deficit  Skin: No rashes  Psych: Normal affect and demeanor, alert and oriented x3    Data  Reviewed:  I have personally reviewed following labs and imaging studies  Micro Results Recent Results (from the past 240 hour(s))  MRSA PCR Screening     Status: None   Collection Time: 08/08/15  5:50 AM  Result Value Ref Range Status   MRSA by PCR NEGATIVE NEGATIVE Final    Comment:        The GeneXpert MRSA Assay (FDA approved for NASAL specimens only), is one component of a comprehensive MRSA colonization surveillance program. It is not intended to diagnose MRSA infection nor to guide or monitor treatment for MRSA infections.     Radiology Reports Dg Chest 2 View  08/08/2015  CLINICAL DATA:  82 year old female with rectal bleeding and atrial fibrillation EXAM: CHEST  2 VIEW COMPARISON:  Chest radiograph dated 01/16/2010 FINDINGS: Two views of the chest do not demonstrate a focal consolidation. There is no pleural effusion or pneumothorax. The cardiac silhouette is within normal limits. There is degenerative changes of the spine and shoulders. No acute osseous pathology. IMPRESSION: No active cardiopulmonary disease. Electronically Signed   By: Elgie Collard M.D.   On: 08/08/2015 02:39   Nm Gi Blood Loss  08/08/2015  CLINICAL DATA:  GI bleeding.  Alcoholic gastritis. EXAM: NUCLEAR MEDICINE GASTROINTESTINAL BLEEDING SCAN TECHNIQUE: Sequential abdominal images were obtained following intravenous administration of Tc-22m labeled red blood cells. RADIOPHARMACEUTICALS:  26.1 mCi Tc-9m in-vitro labeled red cells. COMPARISON:  None. FINDINGS: Negative for active GI hemorrhage. Activity in the left upper quadrant does not change and is attributed to the spleen. Normal biodistribution with progressive bladder filling. IMPRESSION: Negative for active GI bleeding. Electronically Signed   By: Marnee Spring M.D.   On: 08/08/2015 13:15    Lab Data:  CBC:  Recent Labs Lab 08/08/15 0207 08/08/15 1256 08/08/15 2159 08/09/15 0357 08/09/15 0733 08/09/15 1502 08/10/15 0435  WBC 8.0 6.9  8.6  --  10.4 11.6*  --   NEUTROABS 6.2  --   --   --   --   --   --   HGB 7.9* 8.4* 7.8* 8.0* 7.9* 5.7* 10.4*  HCT 25.5* 25.5* 23.4* 24.7* 23.5* 17.3* 30.6*  MCV 100.8* 89.8 89.7  --  84.5 85.6  --   PLT 217 134* 145*  --  118* 141*  --    Basic Metabolic Panel:  Recent Labs Lab 08/08/15 0207 08/08/15 1256  08/10/15 0435  NA 138 141 138  K 3.8 3.8 3.7  CL 105 113* 109  CO2 22 19* 22  GLUCOSE 162* 139* 123*  BUN 22* 14 11  CREATININE 0.83 0.63 0.81  CALCIUM 7.0* 6.2* 6.4*   GFR: Estimated Creatinine Clearance: 36.5 mL/min (by C-G formula based on Cr of 0.81). Liver Function Tests:  Recent Labs Lab 08/08/15 0207 08/08/15 1256  AST 17 14*  ALT 8* 7*  ALKPHOS 54 34*  BILITOT 0.9 1.0  PROT 5.3* 4.2*  ALBUMIN 3.0* 2.2*   No results for input(s): LIPASE, AMYLASE in the last 168 hours. No results for input(s): AMMONIA in the last 168 hours. Coagulation Profile:  Recent Labs Lab 08/08/15 0207  INR 1.23   Cardiac Enzymes:  Recent Labs Lab 08/08/15 0207 08/08/15 1256  TROPONINI <0.03 <0.03   BNP (last 3 results) No results for input(s): PROBNP in the last 8760 hours. HbA1C: No results for input(s): HGBA1C in the last 72 hours. CBG:  Recent Labs Lab 08/09/15 1239 08/09/15 1502 08/09/15 1941 08/09/15 2351 08/10/15 0343  GLUCAP 123* 137* 131* 118* 129*   Lipid Profile: No results for input(s): CHOL, HDL, LDLCALC, TRIG, CHOLHDL, LDLDIRECT in the last 72 hours. Thyroid Function Tests:  Recent Labs  08/08/15 1256  TSH 0.130*   Anemia Panel: No results for input(s): VITAMINB12, FOLATE, FERRITIN, TIBC, IRON, RETICCTPCT in the last 72 hours. Urine analysis:    Component Value Date/Time   COLORURINE YELLOW 01/16/2010 1600   APPEARANCEUR CLEAR 01/16/2010 1600   LABSPEC 1.013 01/16/2010 1600   PHURINE 5.5 01/16/2010 1600   GLUCOSEU NEGATIVE 01/16/2010 1600   HGBUR NEGATIVE 01/16/2010 1600   BILIRUBINUR NEGATIVE 01/16/2010 1600   KETONESUR 40*  01/16/2010 1600   PROTEINUR NEGATIVE 01/16/2010 1600   UROBILINOGEN 0.2 01/16/2010 1600   NITRITE NEGATIVE 01/16/2010 1600   LEUKOCYTESUR SMALL* 01/16/2010 1600     RAI,RIPUDEEP M.D. [facility] 08/10/2015, 10:51 AM  Pager: [phone] Between 7am to 7pm - call Pager - [phone]  After 7pm go to [email or web address] - password [email or web address]  Call night coverage person covering after 7pm

## 2015-08-10 NOTE — Interval H&P Note (Signed)
History and Physical Interval Note:  08/10/2015 8:17 AM  Kristi OrtBarbara J Masini  has presented today for surgery, with the diagnosis of GI bleed  The various methods of treatment have been discussed with the patient and family. After consideration of risks, benefits and other options for treatment, the patient has consented to  Procedure(s): COLONOSCOPY (N/A) as a surgical intervention .  The patient's history has been reviewed, patient examined, no change in status, stable for surgery.  I have reviewed the patient's chart and labs.  Questions were answered to the patient's satisfaction.     Yaritzel Stange JR,Shaday Rayborn L

## 2015-08-11 ENCOUNTER — Encounter (HOSPITAL_COMMUNITY): Payer: Self-pay | Admitting: Gastroenterology

## 2015-08-11 LAB — POTASSIUM: Potassium: 3.7 mmol/L (ref 3.5–5.1)

## 2015-08-11 LAB — BASIC METABOLIC PANEL
ANION GAP: 7 (ref 5–15)
BUN: 7 mg/dL (ref 6–20)
CALCIUM: 6.8 mg/dL — AB (ref 8.9–10.3)
CO2: 23 mmol/L (ref 22–32)
CREATININE: 0.7 mg/dL (ref 0.44–1.00)
Chloride: 111 mmol/L (ref 101–111)
Glucose, Bld: 107 mg/dL — ABNORMAL HIGH (ref 65–99)
Potassium: 2.9 mmol/L — ABNORMAL LOW (ref 3.5–5.1)
SODIUM: 141 mmol/L (ref 135–145)

## 2015-08-11 LAB — HEMOGLOBIN AND HEMATOCRIT, BLOOD
HEMATOCRIT: 26.6 % — AB (ref 36.0–46.0)
HEMATOCRIT: 29 % — AB (ref 36.0–46.0)
HEMOGLOBIN: 8.8 g/dL — AB (ref 12.0–15.0)
Hemoglobin: 9.6 g/dL — ABNORMAL LOW (ref 12.0–15.0)

## 2015-08-11 LAB — MAGNESIUM: MAGNESIUM: 1.7 mg/dL (ref 1.7–2.4)

## 2015-08-11 MED ORDER — PANTOPRAZOLE SODIUM 40 MG PO TBEC: 40.0000 mg | DELAYED_RELEASE_TABLET | Freq: Every day | ORAL | Status: DC

## 2015-08-11 MED ORDER — SENNOSIDES-DOCUSATE SODIUM 8.6-50 MG PO TABS: 1.0000 | ORAL_TABLET | Freq: Every evening | ORAL | Status: DC | PRN

## 2015-08-11 MED ORDER — AMIODARONE HCL 200 MG PO TABS: 200.0000 mg | ORAL_TABLET | Freq: Two times a day (BID) | ORAL | Status: DC

## 2015-08-11 MED ORDER — POTASSIUM CHLORIDE CRYS ER 20 MEQ PO TBCR: 40.0000 meq | EXTENDED_RELEASE_TABLET | Freq: Once | ORAL | Status: DC

## 2015-08-11 MED ORDER — POLYETHYLENE GLYCOL 3350 17 G PO PACK: 17.0000 g | PACK | Freq: Two times a day (BID) | ORAL | Status: DC

## 2015-08-11 MED ORDER — POTASSIUM CHLORIDE 10 MEQ/100ML IV SOLN: 10.0000 meq | INTRAVENOUS | Status: DC

## 2015-08-11 MED ORDER — POTASSIUM CHLORIDE CRYS ER 20 MEQ PO TBCR
40.0000 meq | EXTENDED_RELEASE_TABLET | Freq: Once | ORAL | Status: AC
Start: 2015-08-11 — End: 2015-08-11
  Administered 2015-08-11: 40 meq via ORAL
  Filled 2015-08-11: qty 2

## 2015-08-11 MED ORDER — POTASSIUM CHLORIDE CRYS ER 20 MEQ PO TBCR
40.0000 meq | EXTENDED_RELEASE_TABLET | ORAL | Status: DC
  Administered 2015-08-11: 40 meq via ORAL
  Filled 2015-08-11: qty 2

## 2015-08-11 MED ORDER — POTASSIUM CHLORIDE 10 MEQ/100ML IV SOLN
10.0000 meq | INTRAVENOUS | Status: AC
  Administered 2015-08-11 (×2): 10 meq via INTRAVENOUS
  Filled 2015-08-11: qty 100

## 2015-08-11 MED ORDER — LEVOTHYROXINE SODIUM 75 MCG PO TABS: 75.0000 ug | ORAL_TABLET | Freq: Every day | ORAL | Status: DC

## 2015-08-11 NOTE — Care Management Important Message (Signed)
Important Message  Patient Details  Name: Kristi Morales MRN: [id number] Date of Birth: 10/06/1927   Medicare Important Message Given:  Yes    Raygen Dahm Abena 08/11/2015, 11:50 AM

## 2015-08-11 NOTE — Progress Notes (Signed)
Discharge instructions given and explained to pt along with prescription.

## 2015-08-11 NOTE — Discharge Summary (Signed)
Physician Discharge Summary   Patient ID: Kristi Morales MRN: [id number] DOB/AGE: June 25, 1927 82 y.o.  Admit date: 08/08/2015 Discharge date: 08/11/2015  Primary Care Physician:  Julian Hy, MD  Discharge Diagnoses:    . Acute GI bleeding/Diverticular    Atrial fibrillation with RVR  . Acute blood loss anemia . Hypothyroidism   Hypocalcemia   Hypokalemia  Consults:   Cardiology Gastroenterology, Dr. Randa Evens   Recommendations for Outpatient Follow-up:  1. Patient is started on amiodarone 200 mg twice a day, please follow LFTs, TFTs 2. Please repeat CBC/BMET at next visit, Please follow potassium  3. Please follow TSH   DIET: Heart healthy diet   Allergies:   Allergies  Allergen Reactions  . Morphine And Related     NAUSEA & VOMITTING      DISCHARGE MEDICATIONS: Current Discharge Medication List    START taking these medications   Details  amiodarone (PACERONE) 200 MG tablet Take 1 tablet (200 mg total) by mouth 2 (two) times daily. Qty: 60 tablet, Refills: 3    pantoprazole (PROTONIX) 40 MG tablet Take 1 tablet (40 mg total) by mouth daily. Qty: 30 tablet, Refills: 3    polyethylene glycol (MIRALAX / GLYCOLAX) packet Take 17 g by mouth 2 (two) times daily. Please adjust dose Qty: 60 each, Refills: 3    senna-docusate (SENOKOT S) 8.6-50 MG tablet Take 1 tablet by mouth at bedtime as needed for mild constipation. Qty: 30 tablet, Refills: 3      CONTINUE these medications which have CHANGED   Details  levothyroxine (SYNTHROID, LEVOTHROID) 75 MCG tablet Take 1 tablet (75 mcg total) by mouth daily before breakfast. Qty: 30 tablet, Refills: 3      STOP taking these medications     digoxin (LANOXIN) 0.125 MG tablet      metoprolol tartrate (LOPRESSOR) 25 MG tablet          Brief H and P: For complete details please refer to admission H and P, but in brief77 y.o. female with medical history significant of atrial fibrillation and  hypothyroidism presents to the ER because of rectal bleeding, was found to have significant anemia with Hb of 7.9 from normal prior values, as well as was in A fib with RVR. She was started initially on Cardizem with subsequent bradycardia into the 30s. GI and Cardiology were consulted on admission.   Hospital Course:   Acute GI bleeding : BRBPR, appears to be a diverticular bleed - Hemoglobin trended down to lowest 5.7 on 5/10. Patient received multiple packed RBC units~ 6 units. H&H is stable today, no further bleeding. hemoglobin is 9.6 at the time of discharge. - Tagged RBC scan negative. -GI was consulted. Patient underwent colonoscopy which showed diverticulosis throughout the colon, no active bleeding - Not on any blood thinners or NSAIDs, only on baby aspirin at home  - Patient is now tolerating solid diet without any difficulty, she had no further bleeding episodes.   A fib with RVR: patient with history of PAF - patient's CHA2DS2-VASc Score for Stroke Risk is 4 - she was initially on Eliquis, then Xarelto, could not tolerate either and she is not on [facility] currently -Cardiology was consulted, placed on amiodarone drip as patient had become bradycardic in the ED with Cardizem. She was transitioned to oral amiodarone. - She has follow-up appointment with cardiology scheduled.  ABLA - Patient received multiple packed RBC transfusions during hospitalization. Hemoglobin is 9.6 at the time of discharge.   Hypothyroidism  -  continue synthroid  - TSH is 0.13, too low, need to reduce Synthroid by 25% to daily.   Hypocalcemia, hypokalemia - Replaced  Day of Discharge BP 123/52 mmHg  Pulse 74  Temp(Src) 97.6 F (36.4 C) (Oral)  Resp 29  Ht 5' (1.524 m)  Wt 54.2 kg (119 lb 7.8 oz)  BMI 23.34 kg/m2  SpO2 98%  Physical Exam: General: Alert and awake oriented x3 not in any acute distress. HEENT: anicteric sclera, pupils reactive to light and accommodation CVS: S1-S2 clear  no murmur rubs or gallops Chest: clear to auscultation bilaterally, no wheezing rales or rhonchi Abdomen: soft nontender, nondistended, normal bowel sounds Extremities: no cyanosis, clubbing or edema noted bilaterally Neuro: Cranial nerves II-XII intact, no focal neurological deficits   The results of significant diagnostics from this hospitalization (including imaging, microbiology, ancillary and laboratory) are listed below for reference.    LAB RESULTS: Basic Metabolic Panel:  Recent Labs Lab 08/10/15 0435 08/11/15 0416 08/11/15 1223  NA 138 141  --   K 3.7 2.9* 3.7  CL 109 111  --   CO2 22 23  --   GLUCOSE 123* 107*  --   BUN 11 7  --   CREATININE 0.81 0.70  --   CALCIUM 6.4* 6.8*  --    Liver Function Tests:  Recent Labs Lab 08/08/15 1256 08/10/15 1449  AST 14* 22  ALT 7* 8*  ALKPHOS 34* 41  BILITOT 1.0 0.7  PROT 4.2* 4.3*  ALBUMIN 2.2* 2.3*   No results for input(s): LIPASE, AMYLASE in the last 168 hours. No results for input(s): AMMONIA in the last 168 hours. CBC:  Recent Labs Lab 08/08/15 0207  08/09/15 0733 08/09/15 1502  08/11/15 0416 08/11/15 1019  WBC 8.0  < > 10.4 11.6*  --   --   --   NEUTROABS 6.2  --   --   --   --   --   --   HGB 7.9*  < > 7.9* 5.7*  < > 8.8* 9.6*  HCT 25.5*  < > 23.5* 17.3*  < > 26.6* 29.0*  MCV 100.8*  < > 84.5 85.6  --   --   --   PLT 217  < > 118* 141*  --   --   --   < > = values in this interval not displayed. Cardiac Enzymes:  Recent Labs Lab 08/08/15 0207 08/08/15 1256  TROPONINI <0.03 <0.03   BNP: Invalid input(s): POCBNP CBG:  Recent Labs Lab 08/09/15 2351 08/10/15 0343  GLUCAP 118* 129*    Significant Diagnostic Studies:  Dg Chest 2 View  08/08/2015  CLINICAL DATA:  82 year old female with rectal bleeding and atrial fibrillation EXAM: CHEST  2 VIEW COMPARISON:  Chest radiograph dated 01/16/2010 FINDINGS: Two views of the chest do not demonstrate a focal consolidation. There is no pleural effusion  or pneumothorax. The cardiac silhouette is within normal limits. There is degenerative changes of the spine and shoulders. No acute osseous pathology. IMPRESSION: No active cardiopulmonary disease. Electronically Signed   By: Elgie Collard M.D.   On: 08/08/2015 02:39   Nm Gi Blood Loss  08/08/2015  CLINICAL DATA:  GI bleeding.  Alcoholic gastritis. EXAM: NUCLEAR MEDICINE GASTROINTESTINAL BLEEDING SCAN TECHNIQUE: Sequential abdominal images were obtained following intravenous administration of Tc-50m labeled red blood cells. RADIOPHARMACEUTICALS:  26.1 mCi Tc-13m in-vitro labeled red cells. COMPARISON:  None. FINDINGS: Negative for active GI hemorrhage. Activity in the left upper quadrant does not  change and is attributed to the spleen. Normal biodistribution with progressive bladder filling. IMPRESSION: Negative for active GI bleeding. Electronically Signed   By: Marnee SpringJonathon  Watts M.D.   On: 08/08/2015 13:15    2D ECHO:   Disposition and Follow-up: Discharge Instructions    Diet - low sodium heart healthy    Complete by:  As directed      Increase activity slowly    Complete by:  As directed             DISPOSITION:Home  DISCHARGE FOLLOW-UP Follow-up Information    Follow up with Julian HySOUTH,STEPHEN ALAN, MD. Schedule an appointment as soon as possible for a visit in 1 week.   Specialty:  Endocrinology   Why:  for hospital follow-up, obtain labs BMET   Contact information:   [address] [place] [address] [phone]       Follow up with Sheilah Pigeonenee Lynn Ursuy, PA-C On 08/29/2015.   Specialty:  Cardiology   Why:  at 10:00AM, for hospital follow-up   Contact information:   [address] [place] [address] [phone]       Follow up with EDWARDS JR,JAMES L, MD. Schedule an appointment as soon as possible for a visit in 2 weeks.   Specialty:  Gastroenterology   Why:  for hospital follow-up   Contact information:   [address] [place]  [address] [phone]        Time spent on Discharge: 35 minutes  Signed:   Gerene Nedd M.D. [facility] 08/11/2015, 2:00 PM Pager: [phone]

## 2015-08-11 NOTE — Care Management Note (Signed)
Case Management Note  Patient Details  Name: Kristi Morales MRN: [id number] Date of Birth: 02/07/1928  Subjective/Objective:    Patient is from home alone, pta indep, was on amio drip for afib, had colonoscopy yesterday, hb 10.4 after 7 units, she is for dc today.  No needs.                Action/Plan:   Expected Discharge Date:                  Expected Discharge Plan:  Home/Self Care  In-House Referral:     Discharge planning Services  CM Consult  Post Acute Care Choice:    Choice offered to:     DME Arranged:    DME Agency:     HH Arranged:    HH Agency:     Status of Service:  Completed, signed off  Medicare Important Message Given:  Yes Date Medicare IM Given:    Medicare IM give by:    Date Additional Medicare IM Given:    Additional Medicare Important Message give by:     If discussed at Long Length of Stay Meetings, dates discussed:    Additional Comments:  Leone Havenaylor, Tyreka Henneke Clinton, RN 08/11/2015, 2:11 PM

## 2015-08-11 NOTE — Progress Notes (Signed)
Notified MD of decreased K at 2.9.  No new orders at this time.  Will continue to monitor patient.

## 2015-08-11 NOTE — Progress Notes (Signed)
Patient remains in sinus rhythm.  No new recommendations at this time.  Please continue amiodarone 200 mg bid through 08/18/15.  Then she will start 200 mg daily.  This will complete a 5g load. Agree with starting anticoagulation only if deemed safe per primary team and GI.  f/u appt in our clinc with Francis Dowseenee Ursuy NP ON May 30 at 10:30 AM  [address]  We will sign off.  Please feel free to call if there are questions or concerns.    Lauramae Kneisley C. Duke Salviaandolph, MD, [facility]  08/11/2015  10:40 AM

## 2015-08-12 LAB — TYPE AND SCREEN
ABO/RH(D): O NEG
Antibody Screen: NEGATIVE
UNIT DIVISION: 0
UNIT DIVISION: 0
UNIT DIVISION: 0
UNIT DIVISION: 0
UNIT DIVISION: 0
UNIT DIVISION: 0
Unit division: 0
Unit division: 0
Unit division: 0
Unit division: 0
Unit division: 0

## 2015-08-23 ENCOUNTER — Encounter: Payer: Self-pay | Admitting: Internal Medicine

## 2015-08-29 ENCOUNTER — Ambulatory Visit: Payer: Medicare Other | Admitting: Physician Assistant

## 2015-09-17 NOTE — Progress Notes (Signed)
Patient ID: Kristi OrtBarbara J Morales, female   DOB: 08/25/1927, 82 y.o.   MRN: [id number]    Cardiology Office Note Date:  09/18/2015  Patient ID:  Kristi Morales, DOB 04/29/1927, MRN [id number] PCP:  Julian HySOUTH,STEPHEN ALAN, MD  Electrophysiologist:  Dr. Graciela HusbandsKlein (last in 2011)   Chief Complaint: post hospital visit  History of Present Illness: Kristi Morales is a 82 y.o. female with history of LGIB felt to be diverticular requiring PRBC x6u, discharged 08/11/15, hypothyroidism.  Colonoscopy described multiple diverticula throughout the colon, I don't see any formal recommendations from GI regarding a/c afterwards.  Her PMHx otherwise is PAfib, hypothyroidism, and HTN.  She comes in today to be seen by EP service, last seen by Dr. Graciela HusbandsKlein in 2011.  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  She saw her PMD about 2 weeks ago reported her blood counts stable, and monitoring her thyroid, she sees GI later this week.  We discussed her CHA2DS2Vasc score and stroke risk, risk/benefits of a/c, but she is firm, that she was not willing to take them years ago and especially now, would not be agreeable even if GI felt she could.  She denies any kind of CP, palpitations or SOB, no exertional intolerances despite her anemia, states she is back to walking her mile daily and more on the weekends.  No dizziness, near syncope or syncope, no PND or orthopnea.  The patient denies a dx of HTN, states she tends to be high at MD office never otherwise and has been evaluated by her PMD, never has been on BP medicines.  This gives her CHA2DS2Vasc of 3 for age and gender. She is still ion 200mg  BID of the amiodarone.  AFib Hx: Diagnosed 2011 Failed Eliquis and Xarelto ????   Declined coumadin in 2011, at that time, Dr. Graciela HusbandsKlein felt given her age/gender not appropriate for Pradaxa May 2017 ER   Cardizem gtt caused bradycardia 30's >>> amiodarone Historically Lanoxin/BB    Past Medical History  Diagnosis Date  . A-fib (HCC)   . Thyroid disease   . HTN (hypertension)   . GI bleed 07/2015    Past Surgical History  Procedure Laterality Date  . Appendectomy    . Tonsillectomy    . Cesarean section    . Thyroidectomy  1979  . Colonoscopy N/A 08/10/2015    Procedure: COLONOSCOPY;  Surgeon: Carman ChingJames Edwards, MD;  [facility];  Service: Endoscopy;  Laterality: N/A;    Current Outpatient Prescriptions  Medication Sig Dispense Refill  . amiodarone (PACERONE) 200 MG tablet Take 1 tablet (200 mg total) by mouth 2 (two) times daily. 60 tablet 3  . levothyroxine (SYNTHROID, LEVOTHROID) 75 MCG tablet Take 1 tablet (75 mcg total) by mouth daily before breakfast. 30 tablet 3  . pantoprazole (PROTONIX) 40 MG tablet Take 1 tablet (40 mg total) by mouth daily. 30 tablet 3  . polyethylene glycol (MIRALAX / GLYCOLAX) packet Take 17 g by mouth 2 (two) times daily. Please adjust dose 60 each 3  . senna-docusate (SENOKOT S) 8.6-50 MG tablet Take 1 tablet by mouth at bedtime as needed for mild constipation. 30 tablet 3   No current facility-administered medications for this visit.    Allergies:   Morphine and related   Social History:  The patient  reports that she has never smoked. She  has never used smokeless tobacco. She reports that she drinks alcohol. She reports that she does not use illicit drugs.   Family History:  The patient's family history is negative for Cancer, Diabetes, Heart failure, and Hyperlipidemia.  ROS:  Please see the history of present illness. All other systems are reviewed and otherwise negative.   PHYSICAL EXAM:  VS:  BP 155/94 mmHg  Pulse 70  Ht 5' (1.524 m)  Wt 120 lb (54.432 kg)  BMI 23.44 kg/m2 BMI: Body mass index is 23.44 kg/(m^2). Well nourished, well developed, looks younger then her agein no acute distress HEENT: normocephalic,  atraumatic Neck: no JVD, carotid bruits or masses Cardiac:  normal S1, S2; RRR; no significant murmurs, no rubs, or gallops Lungs:  clear to auscultation bilaterally, no wheezing, rhonchi or rales Abd: soft, nontender MS: no deformity or atrophy Ext: no edema Skin: warm and dry, no rash Neuro:  No gross deficits appreciated Psych: euthymic mood, full affect     EKG:  Done today and reviewed by myself shows SR  01/17/10: Echocardiogram Study Conclusions - Left ventricle: The cavity size was normal. Wall thickness was  increased in a pattern of mild LVH. The estimated ejection  fraction was 65%. Wall motion was normal; there were no regional  wall motion abnormalities. - Aortic valve: Sclerosis without stenosis. - Mitral valve: Mildly calcified annulus. No significant  regurgitation. - Left atrium: The atrium was mildly dilated.  Recent Labs: 08/08/2015: TSH 0.130* 08/09/2015: Platelets 141* 08/10/2015: ALT 8* 08/11/2015: BUN 7; Creatinine, Ser 0.70; Hemoglobin 9.6*; Magnesium 1.7; Potassium 3.7; Sodium 141  No results found for requested labs within last 365 days.   CrCl cannot be calculated (Patient has no serum creatinine result on file.).   Wt Readings from Last 3 Encounters:  09/18/15 120 lb (54.432 kg)  08/08/15 119 lb 7.8 oz (54.2 kg)  02/27/10 128 lb 6.1 oz (58.233 kg)     Other studies reviewed: Additional studies/records reviewed today include: summarized above  ASSESSMENT AND PLAN:  1. PAFib    Started/loaded on amio last month    recommend check surveilence labs but the patient states her PMD just did "full labs" including thyroid only a couple weeks ago and planned for f/u labs in another month or so    CHA2DS2Vasc is 3 for age/female, patient has declined full anticoagulation  2. HTN     Patient denies this  3. Recent LGIB     Pending f/u with GI  Disposition: Decrease her amiodarone to  once daily, we will get an echo doppler, request  her labs from her PMD, and see her back in 33mo, sooner if needed.  Current medicines are reviewed at length with the patient today.  The patient did not have any concerns regarding medicines.  Judith Blonder, PA-C 09/18/2015 1:48 PM     [facility] [address] [place] [address] [phone] (office)  [phone] (fax)

## 2015-09-18 ENCOUNTER — Encounter: Payer: Self-pay | Admitting: Physician Assistant

## 2015-09-18 ENCOUNTER — Ambulatory Visit (INDEPENDENT_AMBULATORY_CARE_PROVIDER_SITE_OTHER): Payer: Medicare Other | Admitting: Physician Assistant

## 2015-09-18 VITALS — BP 155/94 | HR 70 | Ht 60.0 in | Wt 120.0 lb

## 2015-09-18 DIAGNOSIS — I4891 Unspecified atrial fibrillation: Secondary | ICD-10-CM | POA: Diagnosis not present

## 2015-09-18 DIAGNOSIS — K5791 Diverticulosis of intestine, part unspecified, without perforation or abscess with bleeding: Secondary | ICD-10-CM

## 2015-09-18 NOTE — Patient Instructions (Signed)
Medication Instructions:   START  TAKING AMIODARONE  200 MG ONCE A DAY    If you need a refill on your cardiac medications before your next appointment, please call your pharmacy.  Labwork:  NONE ORDER TODAY    Testing/Procedures: Your physician has requested that you have an echocardiogram. Echocardiography is a painless test that uses sound waves to create images of your heart. It provides your doctor with information about the size and shape of your heart and how well your heart's chambers and valves are working. This procedure takes approximately one hour. There are no restrictions for this procedure.     Follow-Up: in 3 MONTHS WITH RENEE   Any Other Special Instructions Will Be Listed Below (If Applicable).

## 2015-10-23 ENCOUNTER — Other Ambulatory Visit (HOSPITAL_COMMUNITY): Payer: Medicare Other

## 2015-10-31 ENCOUNTER — Other Ambulatory Visit (HOSPITAL_COMMUNITY): Payer: Medicare Other

## 2015-12-28 ENCOUNTER — Other Ambulatory Visit: Payer: Self-pay

## 2015-12-28 ENCOUNTER — Ambulatory Visit (HOSPITAL_COMMUNITY): Payer: Medicare Other | Attending: Cardiology

## 2015-12-28 DIAGNOSIS — I4891 Unspecified atrial fibrillation: Secondary | ICD-10-CM | POA: Diagnosis present

## 2015-12-28 DIAGNOSIS — I340 Nonrheumatic mitral (valve) insufficiency: Secondary | ICD-10-CM | POA: Diagnosis not present

## 2015-12-28 DIAGNOSIS — I358 Other nonrheumatic aortic valve disorders: Secondary | ICD-10-CM | POA: Diagnosis not present

## 2015-12-28 DIAGNOSIS — D649 Anemia, unspecified: Secondary | ICD-10-CM | POA: Diagnosis not present

## 2015-12-29 ENCOUNTER — Telehealth: Payer: Self-pay | Admitting: *Deleted

## 2015-12-29 ENCOUNTER — Other Ambulatory Visit: Payer: Self-pay | Admitting: *Deleted

## 2015-12-29 MED ORDER — AMIODARONE HCL 200 MG PO TABS: 200.0000 mg | ORAL_TABLET | Freq: Every day | ORAL | 5 refills | Status: DC

## 2015-12-29 MED ORDER — AMIODARONE HCL 200 MG PO TABS
200.0000 mg | ORAL_TABLET | Freq: Every day | ORAL | 5 refills | Status: DC
Start: 2015-12-29 — End: 2015-12-29

## 2015-12-29 NOTE — Telephone Encounter (Signed)
-----   Message from [facility]ene[facility] Lynn [place] E[place]rsuy, [place]-C sent at 12/29/2015  1:03 PM EDT ----- Please let the patient know her echo was OK, heart muscle strength is good, no significant heart murmurs,  appears similar to her last echo in 2011.  Thanks [employer]e

## 2015-12-29 NOTE — Telephone Encounter (Signed)
SPOKE TO PT ABOUT RESULTS AND VERBALIZED UNDERSTANDING  

## 2016-01-01 ENCOUNTER — Other Ambulatory Visit: Payer: Self-pay | Admitting: *Deleted

## 2016-01-22 ENCOUNTER — Encounter (HOSPITAL_COMMUNITY): Payer: Self-pay | Admitting: Emergency Medicine

## 2016-01-22 ENCOUNTER — Ambulatory Visit (HOSPITAL_COMMUNITY)
Admission: EM | Admit: 2016-01-22 | Discharge: 2016-01-22 | Disposition: A | Discharge status: Home / Self Care | Payer: Medicare Other | Attending: Physician Assistant | Admitting: Physician Assistant

## 2016-01-22 DIAGNOSIS — J069 Acute upper respiratory infection, unspecified: Secondary | ICD-10-CM

## 2016-01-22 MED ORDER — AMOXICILLIN 500 MG PO CAPS: 500.0000 mg | ORAL_CAPSULE | Freq: Three times a day (TID) | ORAL | 0 refills | Status: DC

## 2016-01-22 NOTE — ED Triage Notes (Signed)
[facility] and states that both of her ears are stopped up. States she had a fever of 101 yesterday, but no fever today.

## 2016-01-22 NOTE — Discharge Instructions (Signed)
DON'T FORGET YOUR FLU SHOT

## 2016-01-22 NOTE — ED Provider Notes (Signed)
CSN: [id number]     Arrival date & time 01/22/16  1001 History   First MD Initiated Contact with Patient 01/22/16 1103     Chief Complaint  Patient presents with  . Nasal Congestion   (Consider location/radiation/quality/duration/timing/severity/associated sxs/prior Treatment) HPI 82 Y/O FEMALE WITH RECENT URI SXS. COUGH WITH YELLOW SPUTUM. NO FEVER. STATES SHE IS FEELING BETTER TODAY. NO PAIN. NON SMOKER.  Past Medical History:  Diagnosis Date  . A-fib (HCC)   . GI bleed 07/2015  . HTN (hypertension)   . Thyroid disease    Past Surgical History:  Procedure Laterality Date  . APPENDECTOMY    . CESAREAN SECTION    . COLONOSCOPY N/A 08/10/2015   Procedure: COLONOSCOPY;  Surgeon: Carman ChingJames Edwards, MD;  [facility];  Service: Endoscopy;  Laterality: N/A;  . THYROIDECTOMY  1979  . TONSILLECTOMY     Family History  Problem Relation Age of Onset  . Cancer Neg Hx   . Diabetes Neg Hx   . Heart failure Neg Hx   . Hyperlipidemia Neg Hx    Social History  Substance Use Topics  . Smoking status: Never Smoker  . Smokeless tobacco: Never Used  . Alcohol use No   OB History    No data available     Review of Systems  Denies: HEADACHE, NAUSEA, ABDOMINAL PAIN, CHEST PAIN, CONGESTION, DYSURIA, SHORTNESS OF BREATH  Allergies  Morphine and related  Home Medications   Prior to Admission medications   Medication Sig Start Date End Date Taking? Authorizing Provider  amiodarone (PACERONE) 200 MG tablet Take 1 tablet (200 mg total) by mouth daily. 12/29/15  Yes Renee Norberto SorensonLynn Ursuy, PA-C  levothyroxine (SYNTHROID, LEVOTHROID) 75 MCG tablet Take 1 tablet (75 mcg total) by mouth daily before breakfast. 08/11/15  Yes Ripudeep Jenna LuoK Rai, MD  amoxicillin (AMOXIL) 500 MG capsule Take 1 capsule (500 mg total) by mouth 3 (three) times daily. 01/22/16   Tharon AquasFrank C Nicholai Willette, PA  pantoprazole (PROTONIX) 40 MG tablet Take 1 tablet (40 mg total) by mouth daily. 08/11/15   Ripudeep Jenna LuoK Rai, MD  polyethylene  glycol (MIRALAX / GLYCOLAX) packet Take 17 g by mouth 2 (two) times daily. Please adjust dose 08/11/15   Ripudeep K Rai, MD  senna-docusate (SENOKOT S) 8.6-50 MG tablet Take 1 tablet by mouth at bedtime as needed for mild constipation. 08/11/15   Ripudeep Jenna LuoK Rai, MD   Meds Ordered and Administered this Visit  Medications - No data to display  BP 119/85 (BP Location: Left Arm)   Pulse 78   Temp 97.7 F (36.5 C) (Oral)   Resp 16   SpO2 98%  No data found.   Physical Exam NURSES NOTES AND VITAL SIGNS REVIEWED. CONSTITUTIONAL: Well developed, well nourished, no acute distress HEENT: normocephalic, atraumatic EYES: Conjunctiva normal NECK:normal ROM, supple, no adenopathy PULMONARY:No respiratory distress, normal effort ABDOMINAL: Soft, ND, NT BS+, No CVAT MUSCULOSKELETAL: Normal ROM of all extremities,  SKIN: warm and dry without rash PSYCHIATRIC: Mood and affect, behavior are normal  Urgent Care Course   Clinical Course    Procedures (including critical care time)  Labs Review Labs Reviewed - No data to display  Imaging Review No results found.   Visual Acuity Review  Right Eye Distance:   Left Eye Distance:   Bilateral Distance:    Right Eye Near:   Left Eye Near:    Bilateral Near:         MDM   1. Upper respiratory tract  infection, unspecified type     Patient is reassured that there are no issues that require transfer to higher level of care at this time or additional tests. Patient is advised to continue home symptomatic treatment. Patient is advised that if there are new or worsening symptoms to attend the emergency department, contact primary care provider, or return to UC. Instructions of care provided discharged home in stable condition.    THIS NOTE WAS GENERATED USING A VOICE RECOGNITION SOFTWARE PROGRAM. ALL REASONABLE EFFORTS  WERE MADE TO PROOFREAD THIS DOCUMENT FOR ACCURACY.  I have verbally reviewed the discharge instructions with the  patient. A printed AVS was given to the patient.  All questions were answered prior to discharge.      Tharon Aquas, PA 01/22/16 1341

## 2016-06-29 ENCOUNTER — Other Ambulatory Visit: Payer: Self-pay | Admitting: Physician Assistant

## 2016-08-20 ENCOUNTER — Other Ambulatory Visit: Payer: Self-pay | Admitting: Physician Assistant

## 2016-10-14 ENCOUNTER — Telehealth: Payer: Self-pay

## 2016-10-17 IMAGING — CR DG CHEST 2V
2 series · 2 of 2 positions shown · non-contrast
Comparison: Chest radiograph dated 01/16/2010

CLINICAL DATA: 87-year-old female with rectal bleeding and atrial
fibrillation

EXAM:
CHEST  2 VIEW

[chest lat]
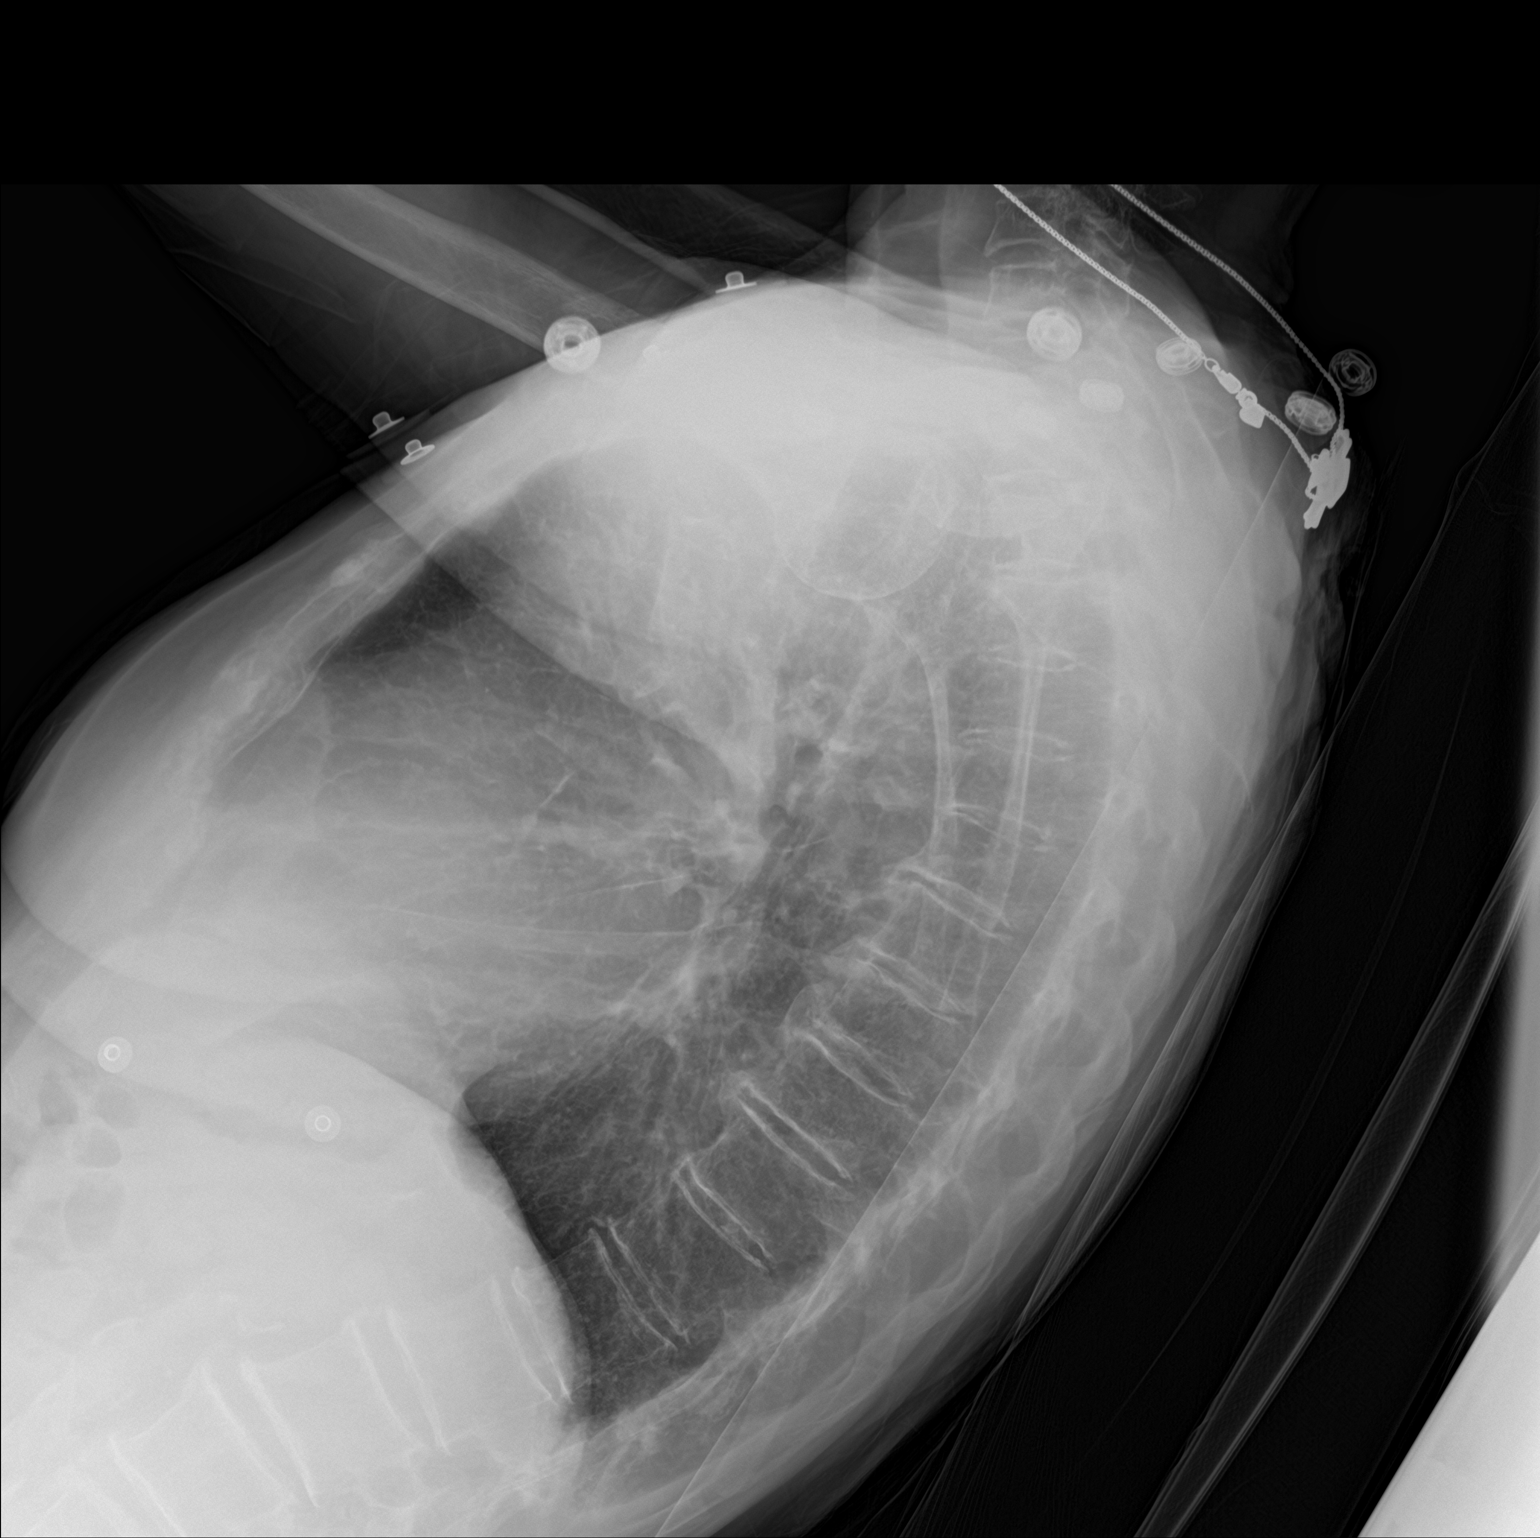

[chest ap]
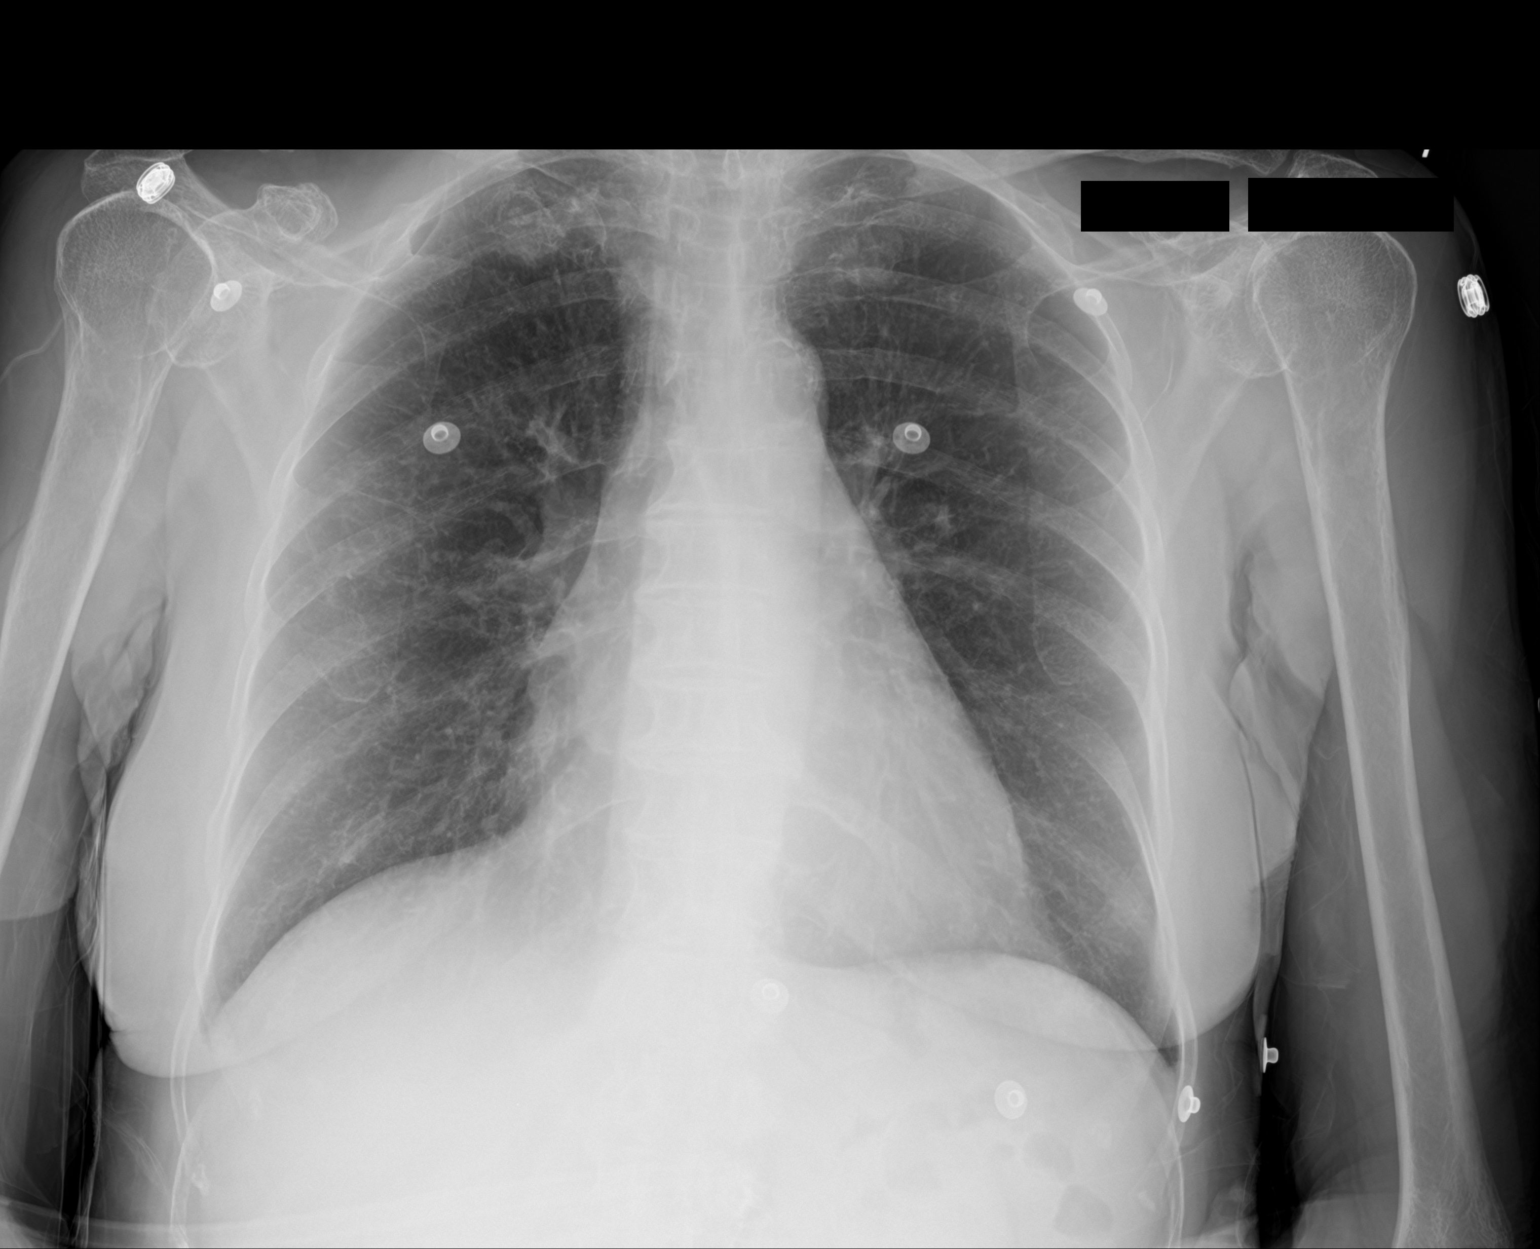

[2 of 2 positions shown; findings below may reference images not displayed]

FINDINGS: Two views of the chest do not demonstrate a focal consolidation.
There is no pleural effusion or pneumothorax. The cardiac silhouette
is within normal limits. There is degenerative changes of the spine
and shoulders. No acute osseous pathology.
IMPRESSION: No active cardiopulmonary disease.

## 2016-10-18 ENCOUNTER — Telehealth: Payer: Self-pay

## 2017-02-27 ENCOUNTER — Other Ambulatory Visit: Payer: Self-pay | Admitting: Physician Assistant

## 2017-02-27 MED ORDER — AMIODARONE HCL 200 MG PO TABS: 200.0000 mg | ORAL_TABLET | Freq: Every day | ORAL | 0 refills | Status: DC

## 2017-03-20 ENCOUNTER — Other Ambulatory Visit: Payer: Self-pay | Admitting: Physician Assistant

## 2017-03-30 NOTE — Progress Notes (Signed)
Patient ID: Kristi OrtBarbara J Eads, female   DOB: 05/03/1927, 83 y.o.   MRN: [id number]    Cardiology Office Note Date:  03/30/2017  Patient ID:  Kristi Morales, DOB 02/01/1928, MRN [id number] PCP:  Adrian PrinceSouth, Stephen, MD  Electrophysiologist:  Dr. Graciela HusbandsKlein (last in 2011)   Chief Complaint: post hospital visit  History of Present Illness: Kristi Morales is a 83 y.o. female with history of LGIB felt to be diverticular requiring PRBC x6u, discharged 08/11/15, hypothyroidism.  Colonoscopy described multiple diverticula throughout the colon, I don't see any formal recommendations from GI regarding a/c afterwards.  Her PMHx otherwise is PAfib, hypothyroidism, and HTN.  She was last seen by EP service by myself in June 2017, last seen by Dr. Graciela HusbandsKlein in 2011.  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  She saw her PMD about 2 weeks prior and reported her blood counts stable, and monitoring her thyroid, she was planned to see GI later that week.  We discussed her CHA2DS2Vasc score and stroke risk, risk/benefits of a/c, but she was firm, that she was not willing to take them years ago and especially now, would not be agreeable even if GI felt she could.  She denied any kind of CP, palpitations or SOB, no exertional intolerances despite her anemia, states she was back to walking her mile daily and more on the weekends.  No dizziness, near syncope or syncope, no PND or orthopnea.  The patient denied a dx of HTN, stated BP tends to be high at MD office never otherwise and has been evaluated by her PMD, never has been on BP medicines.  This gives her CHA2DS2Vasc of 3 for age and gender. She was still on 200mg  BID of the amiodarone, and was decreased to daily at that visit, the patient declined amio surveillance labs reporting her PMD had just done her labs.  She was scheduled for an  echo  She comes today without complaints, though states she wished she did not need to given she sees her PMD Q 3 months and gets labs done there including liver and thyroid tests.  She has not had any palpitations or cardiac awareness at all, no CP or SOB, she remains very active.  She walks 4 flights of stairs at her apartment building, only using the elevator when carrying items.  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  She has no difficulties with her ADLs, and denies any exertional incapacities.  She remains un-agreeable to a/c, touching on this again.  She assures me she has done independent research and understands her stroke risks.   AFib Hx: Diagnosed 2011 Failed Eliquis and Xarelto ????   Declined coumadin in 2011, at that time, Dr. Graciela HusbandsKlein felt given her age/gender not appropriate for Pradaxa May 2017 ER  Cardizem gtt caused bradycardia 30's >>> amiodarone Historically Lanoxin/BB    Past Medical History:  Diagnosis Date  . A-fib (HCC)   . GI bleed 07/2015  . HTN (hypertension)   . Thyroid disease     Past Surgical History:  Procedure Laterality Date  . APPENDECTOMY    . CESAREAN SECTION    . COLONOSCOPY N/A 08/10/2015   Procedure: COLONOSCOPY;  Surgeon: Carman ChingJames Edwards, MD;  [facility];  Service: Endoscopy;  Laterality: N/A;  .  THYROIDECTOMY  1979  . TONSILLECTOMY      Current Outpatient Medications  Medication Sig Dispense Refill  . amiodarone (PACERONE) 200 MG tablet Take 1 tablet (200 mg total) by mouth daily. Please make overdue yearly appointment before anymore refills. 2nd attempt 15 tablet 0  . amoxicillin (AMOXIL) 500 MG capsule Take 1 capsule (500 mg total) by mouth 3 (three) times daily. 21 capsule 0  . levothyroxine (SYNTHROID, LEVOTHROID) 75 MCG tablet Take 1 tablet (75 mcg total) by mouth daily before breakfast. 30 tablet 3  . pantoprazole (PROTONIX) 40 MG tablet Take 1 tablet (40 mg total) by mouth  daily. 30 tablet 3  . polyethylene glycol (MIRALAX / GLYCOLAX) packet Take 17 g by mouth 2 (two) times daily. Please adjust dose 60 each 3  . senna-docusate (SENOKOT S) 8.6-50 MG tablet Take 1 tablet by mouth at bedtime as needed for mild constipation. 30 tablet 3   No current facility-administered medications for this visit.     Allergies:   Morphine and related   Social History:  The patient  reports that  has never smoked. she has never used smokeless tobacco. She reports that she does not drink alcohol or use drugs.   Family History:  The patient's family history is not on file.  ROS:  Please see the history of present illness. All other systems are reviewed and otherwise negative.   PHYSICAL EXAM:  VS:  There were no vitals taken for this visit. BMI: There is no height or weight on file to calculate BMI. Well nourished, well developed, thin, in no acute distress  HEENT: normocephalic, atraumatic  Neck: no JVD, carotid bruits or masses Cardiac:  RRR; no significant murmurs, no rubs, or gallops Lungs:  CTA b/l, no wheezing, rhonchi or rales  Abd: soft, nontender MS: no deformity, age appropriate atrophy Ext: no edema  Skin: warm and dry, no rash Neuro:  No gross deficits appreciated Psych: euthymic mood, full affect   EKG:  Done today and reviewed by myself: SR 77bpm, PR 170ms, QRS 74ms, QTc 468ms  12/28/15: TTE Study Conclusions - Left ventricle: The cavity size was normal. Wall thickness was   normal. Systolic function was normal. The estimated ejection   fraction was in the range of 60% to 65%. Wall motion was normal;   there were no regional wall motion abnormalities. Features are   consistent with a pseudonormal left ventricular filling pattern,   with concomitant abnormal relaxation and increased filling   pressure (grade 2 diastolic dysfunction). - Ventricular septum: The contour showed diastolic flattening. - Aortic valve: Trileaflet; mildly thickened, mildly  calcified   leaflets. - Aorta: Ascending aortic diameter: 39 mm (S). - Ascending aorta: The ascending aorta was mildly dilated. - Mitral valve: Calcified annulus. There was moderate   regurgitation. - Left atrium: The atrium was moderately dilated. Volume/bsa, ES   (1-plane Simpson&'s, A4C): 49 ml/m^2. - Right atrium: The atrium was mildly dilated. - Pulmonary arteries: Systolic pressure was moderately increased.   PA peak pressure: 57 mm Hg (S). Impressions: - Compared to the prior study, there has been no significant   interval change.   01/17/10: Echocardiogram Study Conclusions - Left ventricle: The cavity size was normal. Wall thickness was  increased in a pattern of mild LVH. The estimated ejection  fraction was 65%. Wall motion was normal; there were no regional  wall motion abnormalities. - Aortic valve: Sclerosis without stenosis. - Mitral valve: Mildly calcified annulus. No significant  regurgitation. -  Left atrium: The atrium was mildly dilated.  Recent Labs: No results found for requested labs within last 8760 hours.  No results found for requested labs within last 8760 hours.   CrCl cannot be calculated (Patient's most recent lab result is older than the maximum 21 days allowed.).   Wt Readings from Last 3 Encounters:  09/18/15 120 lb (54.4 kg)  08/08/15 119 lb 7.8 oz (54.2 kg)     Other studies reviewed: Additional studies/records reviewed today include: summarized above  ASSESSMENT AND PLAN:  1. PAFib    CHA2DS2Vasc is 3 for age/female, patient has declined full anticoagulation    Appears to be maintaining SR on amiodarone    She reports PMD does hepatic/thyroid labs, no pulmonary symptoms, educated to get annual eye exams as well  2. HTN     Patient again denies this.  States she was running late to get here and this make her "crazy", she reports her grand daughter is an Charity fundraiser and checks her BP weekly, sometimes more often and is always  140's     She is not agreeable to consider medications, we discussed the improtance of BP control, she is firm, and states only at MD office is it elevated     She will follow with her PMD   Disposition: We will see her back in 1 year, sooner if needed.   Current medicines are reviewed at length with the patient today.  The patient did not have any concerns regarding medicines.  Judith Blonder, PA-C 03/30/2017 11:32 AM     [facility] [address] [place] [address] [phone] (office)  [phone] (fax)

## 2017-03-31 ENCOUNTER — Ambulatory Visit: Payer: Medicare Other | Admitting: Physician Assistant

## 2017-03-31 VITALS — BP 174/78 | HR 77 | Ht 60.0 in | Wt 121.0 lb

## 2017-03-31 DIAGNOSIS — I10 Essential (primary) hypertension: Secondary | ICD-10-CM | POA: Diagnosis not present

## 2017-03-31 DIAGNOSIS — Z79899 Other long term (current) drug therapy: Secondary | ICD-10-CM

## 2017-03-31 DIAGNOSIS — I480 Paroxysmal atrial fibrillation: Secondary | ICD-10-CM

## 2017-03-31 MED ORDER — AMIODARONE HCL 200 MG PO TABS: 200.0000 mg | ORAL_TABLET | Freq: Every day | ORAL | 3 refills | Status: DC

## 2017-03-31 NOTE — Patient Instructions (Addendum)
Medication Instructions:   Your physician recommends that you continue on your current medications as directed. Please refer to the Current Medication list given to you today.   If you need a refill on your cardiac medications before your next appointment, please call your pharmacy.  Labwork: NONE ORDERED  TODAY    Testing/Procedures: NONE ORDERED  TODAY    Follow-Up: Your physician wants you to follow-up in: ONE YEAR WITH  KLEIN   You will receive a reminder letter in the mail two months in advance. If you don't receive a letter, please call our office to schedule the follow-up appointment.     Any Other Special Instructions Will Be Listed Below (If Applicable).                                                                                                                                                    

## 2017-06-20 ENCOUNTER — Other Ambulatory Visit (INDEPENDENT_AMBULATORY_CARE_PROVIDER_SITE_OTHER): Payer: Medicare Other

## 2017-06-20 ENCOUNTER — Encounter: Payer: Self-pay | Admitting: Nurse Practitioner

## 2017-06-20 ENCOUNTER — Ambulatory Visit: Payer: Medicare Other | Admitting: Nurse Practitioner

## 2017-06-20 VITALS — BP 136/82 | HR 56 | Ht 60.0 in | Wt 126.2 lb

## 2017-06-20 DIAGNOSIS — R195 Other fecal abnormalities: Secondary | ICD-10-CM

## 2017-06-20 LAB — CBC
HEMATOCRIT: 36.3 % (ref 36.0–46.0)
HEMOGLOBIN: 12.1 g/dL (ref 12.0–15.0)
MCHC: 33.3 g/dL (ref 30.0–36.0)
MCV: 98.3 fl (ref 78.0–100.0)
PLATELETS: 266 10*3/uL (ref 150.0–400.0)
RBC: 3.69 Mil/uL — AB (ref 3.87–5.11)
RDW: 14.7 % (ref 11.5–15.5)
WBC: 6.8 10*3/uL (ref 4.0–10.5)

## 2017-06-20 NOTE — Progress Notes (Addendum)
Chief Complaint: ? Blood in stool   Referring Provider:     Adrian PrinceStephen South, MD   ASSESSMENT AND PLAN;    #1. Pleasant, relatively healthy 84 yo female here with what she thought was episode of painless rectal bleeding on Wed. Urine was pink same day. Patient later recalled eating beets the day prior and thinks that is what caused the dark reddish fluid with BM and also pink colored uring (same thing happened to her daughter). This was an isolated episode without any associated sx.  -Doesn't explain urine color change but patient does have hx of internal hemorrhoids.  -Patient has a major lower GI bleeding 2017 felt to be diverticular. She was rightfully concerned about ? Blood in stool on Wed. She could be right about the beets but I am going to check CBC today. She is monitoring BMs closely and will call me for any recurrent sx.   #2. Diverticulosis / hx of LGI bleed in 2017.  -Continue MiraLAX to avoid constipation -Patient had questions about any dietary restrictions for diverticulosis.  I explained that we no longer advise patient to avoid nuts /seeds/corn unless known problems with these foods.    Addendum: Reviewed and agree with initial management. Pyrtle, Carie CaddyJay M, MD    HPI:    Patient is an 84 year old female, new to this practice, referred by PCP for blood in stool . Two days ago patient thought she had episode of rectal bleeding,  called PCP and referred here. Her urine was also pink. Patient later figured out that she had eaten a lot of beets the day prior (daughter had same thing happen). She hasn't bled since. No associated abdominal pain,  nausea / vomiting. No dizziness, chest pain or SOB. She actually feels well, still works part time and walks several miles a week. Patient was very concerned when she called PCP about stool contents because she was hospitalized in 2017 with a major lower GI bleed requiring 6 units of blood. I reviewed records / colonoscopy reports. Says  she coded twice in ED. [facility] revealed small and large mouthed tics throughout colon and internal hemorrhoids. She was not on blood thinners nor nsaids at the time.Since the diverticular bleed patient has taken miralax every night and BMs normal. She has questions about any dietary restrictions with diverticular disease.   Past Medical History:  Diagnosis Date  . A-fib (HCC)   . GI bleed 07/2015  . HTN (hypertension)   . Thyroid disease     Past Surgical History:  Procedure Laterality Date  . APPENDECTOMY    . CESAREAN SECTION    . COLONOSCOPY N/A 08/10/2015   Procedure: COLONOSCOPY;  Surgeon: Carman ChingJames Edwards, MD;  [facility];  Service: Endoscopy;  Laterality: N/A;  . THYROIDECTOMY  1979  . TONSILLECTOMY     Family History  Problem Relation Age of Onset  . Cancer Neg Hx   . Diabetes Neg Hx   . Heart failure Neg Hx   . Hyperlipidemia Neg Hx    Social History   Tobacco Use  . Smoking status: Never Smoker  . Smokeless tobacco: Never Used  Substance Use Topics  . Alcohol use: No  . Drug use: No   Current Outpatient Medications  Medication Sig Dispense Refill  . amiodarone (PACERONE) 200 MG tablet Take 1 tablet (200 mg total) by mouth daily. 90 tablet 3  . levothyroxine (SYNTHROID, LEVOTHROID) 75 MCG tablet Take 1 tablet (75 mcg total)  by mouth daily before breakfast. (Patient taking differently: Take 125 mcg by mouth daily before breakfast. ) 30 tablet 3  . polyethylene glycol (MIRALAX / GLYCOLAX) packet Take 17 g by mouth 2 (two) times daily. Please adjust dose (Patient taking differently: Take 17 g by mouth once. Please adjust dose) 60 each 3  . senna-docusate (SENOKOT S) 8.6-50 MG tablet Take 1 tablet by mouth at bedtime as needed for mild constipation. 30 tablet 3   No current facility-administered medications for this visit.    Allergies  Allergen Reactions  . Morphine And Related     NAUSEA & VOMITTING      Review of  Systems: Positive for sleeping problems. All other systems reviewed and negative except where noted in HPI.    Physical Exam:    BP 136/82   Pulse (!) 56   Ht 5' (1.524 m)   Wt 126 lb 3.2 oz (57.2 kg)   BMI 24.65 kg/m  Constitutional:  Well-developed, white female in no acute distress. Psychiatric: Normal mood and affect. Behavior is normal. EENT: Pupils normal.  Conjunctivae are normal. No scleral icterus. Neck supple.  Cardiovascular: Normal rate, regular rhythm. No edema Pulmonary/chest: Effort normal and breath sounds normal. No wheezing, , rales or rhonchi. Abdominal: Soft, nondistended. Nontender. Bowel sounds active throughout. There are no masses palpable. No hepatomegaly. Neurological: Alert and oriented to person place and time. Skin: Skin is warm and dry. No rashes noted.  I spent 25 minutes of face-to-face time with the patient. Greater than 50% of the time was spent counseling and coordinating care. Questions answered  Willette Cluster, NP  06/20/2017, 10:35 AM  Cc: Adrian Prince, MD

## 2017-06-20 NOTE — Patient Instructions (Signed)
If you are age 82 or older, your body mass index should be between 23-30. Your Body mass index is 24.65 kg/m. If this is out of the aforementioned range listed, please consider follow up with your Primary Care Provider.  If you are age 82 or younger, your body mass index should be between 19-25. Your Body mass index is 24.65 kg/m. If this is out of the aformentioned range listed, please consider follow up with your Primary Care Provider.   Your physician has requested that you go to the basement for lab work before leaving today. We will call you with the results.   Thank you for choosing [facility]  Willette ClusterPaula Guenther, NP

## 2018-03-18 ENCOUNTER — Other Ambulatory Visit (HOSPITAL_COMMUNITY): Payer: Self-pay | Admitting: *Deleted

## 2018-03-19 ENCOUNTER — Ambulatory Visit (HOSPITAL_COMMUNITY)
Admission: RE | Admit: 2018-03-19 | Discharge: 2018-03-19 | Disposition: A | Discharge status: Home / Self Care | Payer: Medicare Other | Source: Ambulatory Visit | Attending: Endocrinology | Admitting: Endocrinology

## 2018-03-19 DIAGNOSIS — M810 Age-related osteoporosis without current pathological fracture: Secondary | ICD-10-CM | POA: Diagnosis not present

## 2018-03-19 MED ORDER — DENOSUMAB 60 MG/ML ~~LOC~~ SOSY
PREFILLED_SYRINGE | SUBCUTANEOUS | Status: AC
  Filled 2018-03-19: qty 1

## 2018-03-19 MED ORDER — DENOSUMAB 60 MG/ML ~~LOC~~ SOSY
60.0000 mg | PREFILLED_SYRINGE | Freq: Once | SUBCUTANEOUS | Status: AC
  Administered 2018-03-19: 13:00:00 60 mg via SUBCUTANEOUS

## 2018-03-19 NOTE — Discharge Instructions (Signed)
Denosumab injection °What is this medicine? °DENOSUMAB (den oh sue mab) slows bone breakdown. Prolia is used to treat osteoporosis in women after menopause and in men, and in people who are taking corticosteroids for 6 months or more. Xgeva is used to treat a high calcium level due to cancer and to prevent bone fractures and other bone problems caused by multiple myeloma or cancer bone metastases. Xgeva is also used to treat giant cell tumor of the bone. °This medicine may be used for other purposes; ask your health care provider or pharmacist if you have questions. °COMMON BRAND NAME(S): Prolia, XGEVA °What should I tell my health care provider before I take this medicine? °They need to know if you have any of these conditions: °-dental disease °-having surgery or tooth extraction °-infection °-kidney disease °-low levels of calcium or Vitamin D in the blood °-malnutrition °-on hemodialysis °-skin conditions or sensitivity °-thyroid or parathyroid disease °-an unusual reaction to denosumab, other medicines, foods, dyes, or preservatives °-pregnant or trying to get pregnant °-breast-feeding °How should I use this medicine? °This medicine is for injection under the skin. It is given by a health care professional in a hospital or clinic setting. °A special MedGuide will be given to you before each treatment. Be sure to read this information carefully each time. °For Prolia, talk to your pediatrician regarding the use of this medicine in children. Special care may be needed. For Xgeva, talk to your pediatrician regarding the use of this medicine in children. While this drug may be prescribed for children as young as 13 years for selected conditions, precautions do apply. °Overdosage: If you think you have taken too much of this medicine contact a poison control center or emergency room at once. °NOTE: This medicine is only for you. Do not share this medicine with others. °What if I miss a dose? °It is important not to  miss your dose. Call your doctor or health care professional if you are unable to keep an appointment. °What may interact with this medicine? °Do not take this medicine with any of the following medications: °-other medicines containing denosumab °This medicine may also interact with the following medications: °-medicines that lower your chance of fighting infection °-steroid medicines like prednisone or cortisone °This list may not describe all possible interactions. Give your health care provider a list of all the medicines, herbs, non-prescription drugs, or dietary supplements you use. Also tell them if you smoke, drink alcohol, or use illegal drugs. Some items may interact with your medicine. °What should I watch for while using this medicine? °Visit your doctor or health care professional for regular checks on your progress. Your doctor or health care professional may order blood tests and other tests to see how you are doing. °Call your doctor or health care professional for advice if you get a fever, chills or sore throat, or other symptoms of a cold or flu. Do not treat yourself. This drug may decrease your body's ability to fight infection. Try to avoid being around people who are sick. °You should make sure you get enough calcium and vitamin D while you are taking this medicine, unless your doctor tells you not to. Discuss the foods you eat and the vitamins you take with your health care professional. °See your dentist regularly. Brush and floss your teeth as directed. Before you have any dental work done, tell your dentist you are receiving this medicine. °Do not become pregnant while taking this medicine or for 5 months   after stopping it. Talk with your doctor or health care professional about your birth control options while taking this medicine. Women should inform their doctor if they wish to become pregnant or think they might be pregnant. There is a potential for serious side effects to an unborn  child. Talk to your health care professional or pharmacist for more information. °What side effects may I notice from receiving this medicine? °Side effects that you should report to your doctor or health care professional as soon as possible: °-allergic reactions like skin rash, itching or hives, swelling of the face, lips, or tongue °-bone pain °-breathing problems °-dizziness °-jaw pain, especially after dental work °-redness, blistering, peeling of the skin °-signs and symptoms of infection like fever or chills; cough; sore throat; pain or trouble passing urine °-signs of low calcium like fast heartbeat, muscle cramps or muscle pain; pain, tingling, numbness in the hands or feet; seizures °-unusual bleeding or bruising °-unusually weak or tired °Side effects that usually do not require medical attention (report to your doctor or health care professional if they continue or are bothersome): °-constipation °-diarrhea °-headache °-joint pain °-loss of appetite °-muscle pain °-runny nose °-tiredness °-upset stomach °This list may not describe all possible side effects. Call your doctor for medical advice about side effects. You may report side effects to FDA at 1-800-FDA-1088. °Where should I keep my medicine? °This medicine is only given in a clinic, doctor's office, or other health care setting and will not be stored at home. °NOTE: This sheet is a summary. It may not cover all possible information. If you have questions about this medicine, talk to your doctor, pharmacist, or health care provider. °© 2019 [employer] (2017-07-25 16:10:44) ° °

## 2018-06-11 ENCOUNTER — Encounter: Payer: Self-pay | Admitting: Internal Medicine

## 2018-06-22 ENCOUNTER — Ambulatory Visit: Payer: Medicare Other | Admitting: Internal Medicine

## 2018-07-02 ENCOUNTER — Ambulatory Visit: Payer: Medicare Other | Admitting: Internal Medicine

## 2018-07-02 ENCOUNTER — Telehealth: Payer: Self-pay

## 2018-07-02 MED ORDER — AMIODARONE HCL 200 MG PO TABS: 100.0000 mg | ORAL_TABLET | Freq: Every day | ORAL | 3 refills | Status: DC

## 2018-07-02 NOTE — Telephone Encounter (Signed)
-----   Message from Duke Salvia, MD sent at 07/02/2018  4:59 PM EDT ----- Regarding: RE: Amiodarone Perfect ----- Message ----- From: Oretha Milch, RN Sent: 07/02/2018   4:02 PM EDT To: Duke Salvia, MD Subject: Amiodarone                                     Hello  I talked with this pt today. She wanted to post pone her annual visit. She wanted to let you know she is only taking 100mg  Amiodarone qd vs 200mg . She has had no episodes of AF that she is aware of. Is it ok for her to continue on only 100 qd?  Thanks!

## 2018-07-02 NOTE — Telephone Encounter (Signed)
Pt is aware of Dr Odessa Fleming recommendation of decreasing Amiodarone to 100mg  qd. She has no additional needs or questions.

## 2018-09-09 ENCOUNTER — Other Ambulatory Visit: Payer: Self-pay

## 2018-09-09 ENCOUNTER — Ambulatory Visit (HOSPITAL_COMMUNITY)
Admission: RE | Admit: 2018-09-09 | Discharge: 2018-09-09 | Disposition: A | Discharge status: Home / Self Care | Payer: Medicare Other | Source: Ambulatory Visit | Attending: Endocrinology | Admitting: Endocrinology

## 2018-09-09 DIAGNOSIS — M810 Age-related osteoporosis without current pathological fracture: Secondary | ICD-10-CM | POA: Diagnosis not present

## 2018-09-09 MED ORDER — DENOSUMAB 60 MG/ML ~~LOC~~ SOSY
PREFILLED_SYRINGE | SUBCUTANEOUS | Status: AC
  Administered 2018-09-09: 15:00:00 60 mg via SUBCUTANEOUS
  Filled 2018-09-09: qty 1

## 2018-09-09 MED ORDER — DENOSUMAB 60 MG/ML ~~LOC~~ SOSY
60.0000 mg | PREFILLED_SYRINGE | Freq: Once | SUBCUTANEOUS | Status: AC
  Administered 2018-09-09: 15:00:00 60 mg via SUBCUTANEOUS

## 2018-10-09 ENCOUNTER — Telehealth: Payer: Self-pay | Admitting: Internal Medicine

## 2018-10-09 NOTE — Telephone Encounter (Signed)

## 2018-10-12 ENCOUNTER — Encounter: Payer: Self-pay | Admitting: Internal Medicine

## 2018-10-12 ENCOUNTER — Other Ambulatory Visit: Payer: Self-pay

## 2018-10-12 ENCOUNTER — Ambulatory Visit: Payer: Medicare Other | Admitting: Internal Medicine

## 2018-10-12 VITALS — BP 118/82 | HR 63 | Ht 60.0 in | Wt 122.8 lb

## 2018-10-12 DIAGNOSIS — I4891 Unspecified atrial fibrillation: Secondary | ICD-10-CM

## 2018-10-12 DIAGNOSIS — I480 Paroxysmal atrial fibrillation: Secondary | ICD-10-CM | POA: Diagnosis not present

## 2018-10-12 DIAGNOSIS — I10 Essential (primary) hypertension: Secondary | ICD-10-CM | POA: Diagnosis not present

## 2018-10-12 NOTE — Progress Notes (Signed)
Patient Care Team: Reynold Bowen, MD as PCP - General (Endocrinology)   HPI  Kristi Morales is a 85 y.o. female Seen in follow-up for recurrent infrequent episodes of atrial fibrillation which she ascribes to stress.  She has had a history of GI bleeding.  She has been refusing of anticoagulation.  She was started on amiodarone 2017-and has on her own down titrated to 100 mg a day.  She has had no interval palpitations of which she is aware.  She is able to climb 4 flights of stairs which she does a couple times a day.  She is caring for her daughter who lives adjacently with terminal metastatic breast cancer.  She lost 1 son 2014 and her sons her son is on the faculty at [employer] you  No chest pain dyspnea peripheral edema palpitations or evidence of bleeding.  Patient denies symptoms of GI intolerance, sun sensitivity, neurological symptoms attributable to amiodarone    Thromboembolic risk factors ( age-60, Gender-1) for a CHADSVASc Score of 3  DATE TEST EF   9/17 Echo   55-65 %         Date Cr K TSH LFT Hgb  5/17  3.7<<2.9   9.6  3/19     12.1  6/20 0.9   0.48 7 11.2      Records and Results Reviewed   Past Medical History:  Diagnosis Date  . A-fib ([place])   . GI bleed 07/2015  . HTN (hypertension)   . Thyroid disease     Past Surgical History:  Procedure Laterality Date  . APPENDECTOMY    . CESAREAN SECTION    . COLONOSCOPY N/A 08/10/2015   Procedure: COLONOSCOPY;  Surgeon: Laurence Spates, MD;  Location: [facility] ENDOSCOPY;  Service: Endoscopy;  Laterality: N/A;  . THYROIDECTOMY  1979  . TONSILLECTOMY      Current Meds  Medication Sig  . amiodarone (PACERONE) 200 MG tablet Take 0.5 tablets (100 mg total) by mouth daily.  . hydrochlorothiazide (HYDRODIURIL) 12.5 MG tablet Take 1 tablet by mouth daily.  . polyethylene glycol (MIRALAX / GLYCOLAX) packet Take 17 g by mouth 2 (two) times daily. Please adjust dose  . senna-docusate (SENOKOT S) 8.6-50 MG tablet Take 1  tablet by mouth at bedtime as needed for mild constipation.  Marland Kitchen SYNTHROID 125 MCG tablet Take 1 tablet by mouth daily.    Allergies  Allergen Reactions  . Morphine And Related     NAUSEA & VOMITTING       Review of Systems negative except from HPI and PMH  Physical Exam BP 118/82   Pulse 63   Ht 5' (1.524 m)   Wt 122 lb 12.8 oz (55.7 kg)   SpO2 98%   BMI 23.98 kg/m  Well developed and well nourished in no acute distress HENT normal E scleral and icterus clear Neck Supple JVP flat; carotids brisk and full Clear to ausculation  Regular rate and rhythm, no murmurs gallops or rub Soft with active bowel sounds No clubbing cyanosis  Edema Alert and oriented, grossly normal motor and sensory function Skin Warm and Dry  ECG  Sinus @ 63 17/08/48  Assessment and  Plan  Atrial fibrillation-paroxysmal  High risk medication surveillance   The patient has infrequent atrial fibrillation of which she is aware.  She is on amiodarone having down titrated herself to 100 mg a day.  Would leave her there.  She is averse to anticoagulation.  Takes a diuretic;  will defer potassium follow-up to her PCP.  He is doing a great job of following amiodarone surveillance laboratories as noted above     Current medicines are reviewed at length with the patient today .  The patient does not  have concerns regarding medicines.

## 2018-10-12 NOTE — Patient Instructions (Signed)
Medication Instructions:  Your physician recommends that you continue on your current medications as directed. Please refer to the Current Medication list given to you today.  Labwork: None ordered.  Testing/Procedures: None ordered.  Follow-Up: Your physician recommends that you schedule a follow-up appointment in:   12 mo with Dr. Klein   Any Other Special Instructions Will Be Listed Below (If Applicable).     If you need a refill on your cardiac medications before your next appointment, please call your pharmacy.  

## 2019-06-04 ENCOUNTER — Other Ambulatory Visit (HOSPITAL_COMMUNITY): Payer: Self-pay | Admitting: *Deleted

## 2019-06-07 ENCOUNTER — Other Ambulatory Visit: Payer: Self-pay

## 2019-06-07 ENCOUNTER — Ambulatory Visit (HOSPITAL_COMMUNITY)
Admission: RE | Admit: 2019-06-07 | Discharge: 2019-06-07 | Disposition: A | Discharge status: Home / Self Care | Payer: Medicare PPO | Source: Ambulatory Visit | Attending: Endocrinology | Admitting: Endocrinology

## 2019-06-07 DIAGNOSIS — M810 Age-related osteoporosis without current pathological fracture: Secondary | ICD-10-CM | POA: Insufficient documentation

## 2019-06-07 MED ORDER — DENOSUMAB 60 MG/ML ~~LOC~~ SOSY
PREFILLED_SYRINGE | SUBCUTANEOUS | Status: AC
  Administered 2019-06-07: 60 mg via SUBCUTANEOUS
  Filled 2019-06-07: qty 1

## 2019-06-07 MED ORDER — DENOSUMAB 60 MG/ML ~~LOC~~ SOSY: 60.0000 mg | PREFILLED_SYRINGE | Freq: Once | SUBCUTANEOUS | Status: AC

## 2019-12-23 ENCOUNTER — Other Ambulatory Visit: Payer: Self-pay

## 2019-12-23 DIAGNOSIS — I739 Peripheral vascular disease, unspecified: Secondary | ICD-10-CM

## 2020-01-07 ENCOUNTER — Other Ambulatory Visit (HOSPITAL_COMMUNITY): Payer: Self-pay

## 2020-01-10 ENCOUNTER — Ambulatory Visit (HOSPITAL_COMMUNITY)
Admission: RE | Admit: 2020-01-10 | Discharge: 2020-01-10 | Disposition: A | Discharge status: Home / Self Care | Payer: Medicare PPO | Source: Ambulatory Visit | Attending: Endocrinology | Admitting: Endocrinology

## 2020-01-10 ENCOUNTER — Ambulatory Visit: Payer: Medicare PPO | Admitting: Surgery

## 2020-01-10 ENCOUNTER — Other Ambulatory Visit: Payer: Self-pay

## 2020-01-10 ENCOUNTER — Ambulatory Visit (HOSPITAL_COMMUNITY)
Admission: RE | Admit: 2020-01-10 | Discharge: 2020-01-10 | Disposition: A | Discharge status: Home / Self Care | Payer: Medicare PPO | Source: Ambulatory Visit | Attending: Surgery | Admitting: Surgery

## 2020-01-10 ENCOUNTER — Encounter: Payer: Self-pay | Admitting: Surgery

## 2020-01-10 VITALS — BP 166/102 | HR 110 | Temp 97.9°F | Resp 20 | Ht 60.0 in | Wt 123.0 lb

## 2020-01-10 DIAGNOSIS — I739 Peripheral vascular disease, unspecified: Secondary | ICD-10-CM | POA: Diagnosis not present

## 2020-01-10 DIAGNOSIS — I70213 Atherosclerosis of native arteries of extremities with intermittent claudication, bilateral legs: Secondary | ICD-10-CM | POA: Diagnosis not present

## 2020-01-10 MED ORDER — DENOSUMAB 60 MG/ML ~~LOC~~ SOSY
60.0000 mg | PREFILLED_SYRINGE | Freq: Once | SUBCUTANEOUS | Status: AC
  Administered 2020-01-10: 60 mg via SUBCUTANEOUS

## 2020-01-10 MED ORDER — DENOSUMAB 60 MG/ML ~~LOC~~ SOSY
PREFILLED_SYRINGE | SUBCUTANEOUS | Status: AC
  Filled 2020-01-10: qty 1

## 2020-01-10 NOTE — Progress Notes (Signed)
[facility]  Patient name: Kristi Morales MRN: [id number] DOB: 01-Jun-1927 Sex: female   REQUESTING PROVIDER:    Dr. Evlyn Kanner   REASON FOR CONSULT:    PAD  HISTORY OF PRESENT ILLNESS:   Kristi Morales is a 86 y.o. female, who is referred for evaluation of peripheral vascular disease.  The patient states that approximately 3 months ago she developed severe right calf pain with ambulation.  She could only walk approximately 200 feet before she had excruciating pain in her right calf.  This was alleviated with rest.  She continued to try to push herself with walking.  Approximately 2 weeks ago she began being able to walk over a mile without any symptoms.  She has not had any further issues with her calf.  She denies any episodes of nonhealing wounds on her legs.  She does not have rest pain.  She has a history of atrial fibrillation.  She is not on anticoagulation secondary to GI bleed.  She is medically managed for hypertension.  She is a non-smoker.  PAST MEDICAL HISTORY    Past Medical History:  Diagnosis Date  . A-fib ([facility])   . GI bleed 07/2015  . HTN (hypertension)   . Thyroid disease      FAMILY HISTORY   Family History  Problem Relation Age of Onset  . Cancer Neg Hx   . Diabetes Neg Hx   . Heart failure Neg Hx   . Hyperlipidemia Neg Hx     SOCIAL HISTORY:   Social History   Socioeconomic History  . Marital status: Widowed    Spouse name: Not on file  . Number of children: Not on file  . Years of education: Not on file  . Highest education level: Not on file  Occupational History  . Not on file  Tobacco Use  . Smoking status: Never Smoker  . Smokeless tobacco: Never Used  Vaping Use  . Vaping Use: Never used  Substance and Sexual Activity  . Alcohol use: No  . Drug use: No  . Sexual activity: Not on file  Other Topics Concern  . Not on file  Social History Narrative  . Not on file    Social Determinants of Health   Financial Resource Strain:   . Difficulty of Paying Living Expenses: Not on file  Food Insecurity:   . Worried About Programme researcher, broadcasting/film/video in the Last Year: Not on file  . Ran Out of Food in the Last Year: Not on file  Transportation Needs:   . Lack of Transportation (Medical): Not on file  . Lack of Transportation (Non-Medical): Not on file  Physical Activity:   . Days of Exercise per Week: Not on file  . Minutes of Exercise per Session: Not on file  Stress:   . Feeling of Stress : Not on file  Social Connections:   . Frequency of Communication with Friends and Family: Not on file  . Frequency of Social Gatherings with Friends and Family: Not on file  . Attends Religious Services: Not on file  . Active Member of Clubs or Organizations: Not on file  . Attends Banker Meetings: Not on file  . Marital Status: Not on file  Intimate Partner Violence:   . Fear of Current or Ex-Partner: Not on file  . Emotionally Abused: Not on file  . Physically Abused: Not on file  . Sexually Abused: Not on file  ALLERGIES:    Allergies  Allergen Reactions  . Morphine And Related     NAUSEA & VOMITTING     CURRENT MEDICATIONS:    Current Outpatient Medications  Medication Sig Dispense Refill  . amiodarone (PACERONE) 200 MG tablet Take 0.5 tablets (100 mg total) by mouth daily. (Patient taking differently: Take 100 mg by mouth every other day. ) 45 tablet 3  . hydrochlorothiazide (HYDRODIURIL) 12.5 MG tablet Take 1 tablet by mouth daily.    . polyethylene glycol (MIRALAX / GLYCOLAX) packet Take 17 g by mouth 2 (two) times daily. Please adjust dose 60 each 3  . SYNTHROID 125 MCG tablet Take 1 tablet by mouth daily.     No current facility-administered medications for this visit.    REVIEW OF SYSTEMS:   [X]  denotes positive finding, [ ]  denotes negative finding Cardiac  Comments:  Chest pain or chest pressure:    Shortness of breath  upon exertion:    Short of breath when lying flat:    Irregular heart rhythm: x       Vascular    Pain in calf, thigh, or hip brought on by ambulation: x   Pain in feet at night that wakes you up from your sleep:     Blood clot in your veins:    Leg swelling:         Pulmonary    Oxygen at home:    Productive cough:     Wheezing:         Neurologic    Sudden weakness in arms or legs:     Sudden numbness in arms or legs:     Sudden onset of difficulty speaking or slurred speech:    Temporary loss of vision in one eye:     Problems with dizziness:         Gastrointestinal    Blood in stool:      Vomited blood:         Genitourinary    Burning when urinating:     Blood in urine:        Psychiatric    Major depression:         Hematologic    Bleeding problems:    Problems with blood clotting too easily:        Skin    Rashes or ulcers:        Constitutional    Fever or chills:     PHYSICAL EXAM:   Vitals:   01/10/20 1022  BP: (!) 166/102  Pulse: (!) 110  Resp: 20  Temp: 97.9 F (36.6 C)  SpO2: 93%  Weight: 123 lb (55.8 kg)  Height: 5' (1.524 m)    GENERAL: The patient is a well-nourished female, in no acute distress. The vital signs are documented above. CARDIAC: There is a regular rate and rhythm.  VASCULAR: No edema to either extremity.  Palpable dorsalis pedis pulse bilaterally PULMONARY: Nonlabored respirations  MUSCULOSKELETAL: There are no major deformities or cyanosis. NEUROLOGIC: No focal weakness or paresthesias are detected. SKIN: There are no ulcers or rashes noted. PSYCHIATRIC: The patient has a normal affect.  STUDIES:   I have reviewed the following: +-------+-----------+-----------+------------+------------+  ABI/TBIToday's ABIToday's TBIPrevious ABIPrevious TBI  +-------+-----------+-----------+------------+------------+  Right 1.01    0.48                   +-------+-----------+-----------+------------+------------+  Left  1.06    0.33                  +-------+-----------+-----------+------------+------------+  All waveforms are triphasic Right toe pressure:  90 Left toe pressure:  61  ASSESSMENT and PLAN   lower extremity atherosclerotic vascular disease: Fortunately, the patient's right leg cramping has completely subsided.  Ultrasound evaluation today shows normal ankle-brachial indices with triphasic waveforms.  In addition she has palpable dorsalis pedis pulses.  I discussed with the patient that I think that it is unlikely that her episode was related to vascular insufficiency given the findings today.  I did discuss that her toe pressures are decreased indicating that she has foot disease.  I told her that there was nothing that we would do about this other than medical management and encouraging ambulation.  I have her scheduled to follow-up as needed.   Charlena Cross, MD, FACS [facility] [phone] Pager [phone]

## 2021-01-28 NOTE — Progress Notes (Signed)
Patient ID: Kristi Morales, female   DOB: 11/03/1927, 87 y.o.   MRN: [id number]    Cardiology Office Note Date:  01/28/2021  Patient ID:  Kristi, Morales 07-27-1927, MRN [id number] PCP:  Adrian Prince, MD  Electrophysiologist:  Dr. Graciela Husbands    Chief Complaint: annual visit  History of Present Illness: Kristi Morales is a 87 y.o. female with history of LGIB felt to be diverticular requiring PRBC x6u, discharged 08/11/15, hypothyroidism, AFib    I have seen her years ago  She comes in today to be seen for Dr. Graciela Husbands, last seen by him July 2020, she was helping to care for her daughter who suffering from terminal breast cancer.  Able to walk 4 flights of stairs that she was doing a couple times a day. She had self reduced her amiodarone to 100mg  daily, minimal symptoms of AFib, continued to decline a/c.  She saw Dr. for suspect claudication though ABI minially abnormal and not felt to be c/w significant PVD.  She did have disease feet recommending medical management  TODAY She is here because when at the dentist/oral surgeon's office her BP was very high She reports as high as 275/127, no particular intervention other then monitoring her for quite a while hoping it would improve, she went from there to her PMD office same day and was still high by their nurse, she did not see the doctor, no new medicines. She went home and by then she got 140's/80's This was 11/2 weeks ago. She had been off her metoprolol for 2 weeks, says the surgeon's office told her to stop in for 2 weeks prior to the surgery (she is certain that  is what they told her to do)  About 3 weeks or so ago (about the time she stopped her metoprolol) she saw when checking her BP (as she does BID) that her HR was staying 110's and started to take a whole amiodarone pill every day (increased from QOD), though her HR has maintained 110s since anyway.  She feels "just fine", walks every single day for exercise and  denies any kind of CP, palpitations or cardiac awareness of any kind No SOB or DOE No near syncope or syncope.   AFib Hx: Diagnosed 2011 Failed Eliquis and Xarelto ????   Declined coumadin in 2011, at that time, Dr. 2012 felt given her age/gender not appropriate for Pradaxa May 2017 ER  Cardizem gtt caused bradycardia 30's >>> amiodarone in 2017 Historically Lanoxin/BB    Past Medical History:  Diagnosis Date   A-fib [facility])    GI bleed 07/2015   HTN (hypertension)    Thyroid disease     Past Surgical History:  Procedure Laterality Date   APPENDECTOMY     CESAREAN SECTION     COLONOSCOPY N/A 08/10/2015   Procedure: COLONOSCOPY;  Surgeon: 10/10/2015, MD;  Location: [facility] ENDOSCOPY;  Service: Endoscopy;  Laterality: N/A;   THYROIDECTOMY  1979   TONSILLECTOMY      Current Outpatient Medications  Medication Sig Dispense Refill   amiodarone (PACERONE) 200 MG tablet Take 0.5 tablets (100 mg total) by mouth daily. (Patient taking differently: Take 100 mg by mouth every other day. ) 45 tablet 3   hydrochlorothiazide (HYDRODIURIL) 12.5 MG tablet Take 1 tablet by mouth daily.     polyethylene glycol (MIRALAX / GLYCOLAX) packet Take 17 g by mouth 2 (two) times daily. Please adjust dose 60 each 3   SYNTHROID 125 MCG tablet Take  1 tablet by mouth daily.     No current facility-administered medications for this visit.    Allergies:   Morphine and related   Social History:  The patient  reports that she has never smoked. She has never used smokeless tobacco. She reports that she does not drink alcohol and does not use drugs.   Family History:  The patient's family history is not on file.  ROS:  Please see the history of present illness. All other systems are reviewed and otherwise negative.   PHYSICAL EXAM:  VS:  There were no vitals taken for this visit. BMI: There is no height or weight on file to calculate BMI. Well nourished, well developed, thin, in no acute distress  HEENT:  normocephalic, atraumatic  Neck: no JVD, carotid bruits or masses Cardiac:  RRR; no significant murmurs, no rubs, or gallops Lungs:  CTA b/l, no wheezing, rhonchi or rales  Abd: soft, nontender MS: no deformity, age appropriate atrophy Ext: no edema  Skin: warm and dry, no rash Neuro:  No gross deficits appreciated Psych: euthymic mood, full affect   EKG:  Done today and reviewed by myself:  AFlutter 118bpm, no ischemic looking changes  12/28/15: TTE Study Conclusions - Left ventricle: The cavity size was normal. Wall thickness was   normal. Systolic function was normal. The estimated ejection   fraction was in the range of 60% to 65%. Wall motion was normal;   there were no regional wall motion abnormalities. Features are   consistent with a pseudonormal left ventricular filling pattern,   with concomitant abnormal relaxation and increased filling   pressure (grade 2 diastolic dysfunction). - Ventricular septum: The contour showed diastolic flattening. - Aortic valve: Trileaflet; mildly thickened, mildly calcified   leaflets. - Aorta: Ascending aortic diameter: 39 mm (S). - Ascending aorta: The ascending aorta was mildly dilated. - Mitral valve: Calcified annulus. There was moderate   regurgitation. - Left atrium: The atrium was moderately dilated. Volume/bsa, ES   (1-plane Simpson&'s, A4C): 49 ml/m^2. - Right atrium: The atrium was mildly dilated. - Pulmonary arteries: Systolic pressure was moderately increased.   PA peak pressure: 57 mm Hg (S). Impressions:  - Compared to the prior study, there has been no significant   interval change.    01/17/10: Echocardiogram  Study Conclusions - Left ventricle: The cavity size was normal. Wall thickness was    increased in a pattern of mild LVH. The estimated ejection    fraction was 65%. Wall motion was normal; there were no regional    wall motion abnormalities.  - Aortic valve: Sclerosis without stenosis.  - Mitral valve:  Mildly calcified annulus. No significant    regurgitation.  - Left atrium: The atrium was mildly dilated.  Recent Labs: No results found for requested labs within last 8760 hours.  No results found for requested labs within last 8760 hours.   CrCl cannot be calculated (Patient's most recent lab result is older than the maximum 21 days allowed.).   Wt Readings from Last 3 Encounters:  01/10/20 123 lb (55.8 kg)  10/12/18 122 lb 12.8 oz (55.7 kg)  06/20/17 126 lb 3.2 oz (57.2 kg)     Other studies reviewed: Additional studies/records reviewed today include: summarized above  ASSESSMENT AND PLAN:  1. PAFib    CHA2DS2Vasc is 4, patient has declined full anticoagulation   She has been on amiodarone for years I suspect she has been in Aflutter for weeks by her observation of persistent HR  110s for the last 3 weeks or so I do not anticipate that the amiodarone will convert her, Anette Riedel may be helping with rate  We had a lengthy discussion today about Aflutter (like AFib) carries significant increase for her in stroke risk and discussed the role of anticoagulation potential risks and benefits. She tells me that she had NEVER taken either Eliquis or xarelto, though some years ago discused.  She recounts how she almost died from GIB Aug 11, 2015) and just can not bring herself to be on a blood thinner worried that if she bled again she would most certainly die.  She tells me that she had never had bleeding again. Given no rcurrent bleeding, I urged her to reconsider anticoagulation. She is not ready for that She has been with Dr. Evlyn Kanner for 30+ years and says she can get in to see him at a moments notice and will see him in the next few days and see what he says.  I recommend resuming her metoprolol and updating her echo and having her see Dr. Graciela Husbands  2. HTN     Patient denies this diagnosis     Resume her metoprolol     She reports 110's-140's/80s at home and says that her home cuff has been  checked and is accurate   Disposition: as above, I will send my note to her PMD and Dr. Graciela Husbands   Current medicines are reviewed at length with the patient today.  The patient did not have any concerns regarding medicines.  Judith Blonder, PA-C 01/28/2021 1:52 PM     [facility] [address] [place] [address] [phone] (office)  [phone] (fax)

## 2021-01-30 ENCOUNTER — Telehealth: Payer: Self-pay | Admitting: *Deleted

## 2021-01-30 NOTE — Telephone Encounter (Signed)
   Pre-operative Risk Assessment    Patient Name: Kristi Morales  DOB: 07-08-1927 MRN: [id number]     Request for Surgical Clearance   Procedure:  Dental Extraction - No. of Teeth:  10 TEETH w/BONE GRAFTING  Date of Surgery: Clearance TBD                                 Surgeon:  DR. Lincoln Brigham, DDS Surgeon's Group or Practice Name:  [facility] Phone number:  [phone] Fax number:  [phone]   Type of Clearance Requested: - Medical    Type of Anesthesia:   IV SEDATION   Additional requests/questions:   Elpidio Anis   01/30/2021, 2:50 PM

## 2021-01-31 ENCOUNTER — Encounter: Payer: Self-pay | Admitting: Physician Assistant

## 2021-01-31 ENCOUNTER — Other Ambulatory Visit: Payer: Self-pay

## 2021-01-31 ENCOUNTER — Ambulatory Visit: Payer: Medicare PPO | Admitting: Physician Assistant

## 2021-01-31 VITALS — BP 174/88 | HR 118 | Ht 60.0 in | Wt 125.0 lb

## 2021-01-31 DIAGNOSIS — I4892 Unspecified atrial flutter: Secondary | ICD-10-CM | POA: Diagnosis not present

## 2021-01-31 DIAGNOSIS — I10 Essential (primary) hypertension: Secondary | ICD-10-CM

## 2021-01-31 DIAGNOSIS — I4891 Unspecified atrial fibrillation: Secondary | ICD-10-CM | POA: Diagnosis not present

## 2021-01-31 NOTE — Patient Instructions (Addendum)
Medication Instructions:   Your physician recommends that you continue on your current medications as directed. Please refer to the Current Medication list given to you today.   *If you need a refill on your cardiac medications before your next appointment, please call your pharmacy*   Lab Work: NONE ORDERED  TODAY   If you have labs (blood work) drawn today and your tests are completely normal, you will receive your results only by: MyChart Message (if you have MyChart) OR A paper copy in the mail If you have any lab test that is abnormal or we need to change your treatment, we will call you to review the results.   Testing/Procedures: Your physician has requested that you have an echocardiogram. Echocardiography is a painless test that uses sound waves to create images of your heart. It provides your doctor with information about the size and shape of your heart and how well your heart's chambers and valves are working. This procedure takes approximately one hour. There are no restrictions for this procedure.    Follow-Up: At [facility], you and your health needs are our priority.  As part of our continuing mission to provide you with exceptional heart care, we have created designated Provider Care Teams.  These Care Teams include your primary Cardiologist (physician) and Advanced Practice Providers (APPs -  Physician Assistants and Nurse Practitioners) who all work together to provide you with the care you need, when you need it.  We recommend signing up for the patient portal called "MyChart".  Sign up information is provided on this After Visit Summary.  MyChart is used to connect with patients for Virtual Visits (Telemedicine).  Patients are able to view lab/test results, encounter notes, upcoming appointments, etc.  Non-urgent messages can be sent to your provider as well.   To learn more about what you can do with MyChart, go to [email or web address].    Your next  appointment:   AFTER ECHO ASAP WITH DR Graciela Husbands    The format for your next appointment:   In Person  Provider:   Sherryl Manges, MD   Other Instructions  PLEASE MAKE APPOINTMENT WITH YOUR PRIMARY CARE PROVIDER AS SOON AS YOU CAN

## 2021-02-27 ENCOUNTER — Ambulatory Visit (HOSPITAL_COMMUNITY): Payer: Medicare PPO | Attending: Cardiology

## 2021-02-27 ENCOUNTER — Encounter (INDEPENDENT_AMBULATORY_CARE_PROVIDER_SITE_OTHER): Payer: Self-pay

## 2021-02-27 ENCOUNTER — Other Ambulatory Visit: Payer: Self-pay

## 2021-02-27 DIAGNOSIS — I4892 Unspecified atrial flutter: Secondary | ICD-10-CM | POA: Diagnosis not present

## 2021-02-27 LAB — ECHOCARDIOGRAM COMPLETE
Area-P 1/2: 4.49 cm2
MV M vel: 5.96 m/s
MV Peak grad: 142.1 mmHg
MV VTI: 0.03 cm2
Radius: 0.7 cm
S' Lateral: 2.4 cm

## 2021-02-27 MED ORDER — PERFLUTREN LIPID MICROSPHERE
1.0000 mL | INTRAVENOUS | Status: AC | PRN
  Administered 2021-02-27: 2 mL via INTRAVENOUS

## 2021-03-05 ENCOUNTER — Encounter: Payer: Self-pay | Admitting: Internal Medicine

## 2021-03-05 ENCOUNTER — Other Ambulatory Visit: Payer: Self-pay

## 2021-03-05 ENCOUNTER — Ambulatory Visit: Payer: Medicare PPO | Admitting: Internal Medicine

## 2021-03-05 VITALS — BP 150/80 | HR 155 | Ht 60.0 in | Wt 128.0 lb

## 2021-03-05 DIAGNOSIS — I4891 Unspecified atrial fibrillation: Secondary | ICD-10-CM | POA: Diagnosis not present

## 2021-03-05 NOTE — Patient Instructions (Signed)

## 2021-03-05 NOTE — Progress Notes (Deleted)
Patient Care Team: Adrian Prince, MD as PCP - General (Endocrinology)   HPI  Kristi Morales is a 87 y.o. female Seen in follow-up for recurrent infrequent episodes of atrial fibrillation which she ascribes to stress.  She has had a history of GI bleeding.  She has been refusing of anticoagulation.  She was started on amiodarone 2017-and has on her own down titrated to 100 mg a day.     The patient denies chest pain***, shortness of breath***, nocturnal dyspnea***, orthopnea*** or peripheral edema***.  There have been no palpitations***, lightheadedness*** or syncope***.  Complains of ***.  Patient denies symptoms of GI intolerance, sun sensitivity, neurological symptoms attributable to amiodarone.  Surveillance laboratories were in normal limits when checked {NUMBERS 0-12:18577} {TIME UNITS:20210}  ago      She is able to climb 4 flights of stairs which she does a couple times a day.  She is caring for her daughter who lives adjacently with terminal metastatic breast cancer.  She lost 1 son 2014 and her sons her son is on the faculty at [employer] you      Thromboembolic risk factors ( age-59, Gender-1) for a CHADSVASc Score of 3  DATE TEST EF   9/17 Echo   55-65 %   11/22 Echo  60-65% PA press 71 mm  MR mod BAE-severe (LA 53 ml/m2)   Date Cr K TSH LFT Hgb  5/17  3.7<<2.9   9.6  3/19     12.1  6/20 0.9   0.48 7 11.2      Records and Results Reviewed   Past Medical History:  Diagnosis Date   A-fib (HCC)    GI bleed 07/2015   HTN (hypertension)    Thyroid disease     Past Surgical History:  Procedure Laterality Date   APPENDECTOMY     CESAREAN SECTION     COLONOSCOPY N/A 08/10/2015   Procedure: COLONOSCOPY;  Surgeon: Carman Ching, MD;  Location: [facility];  Service: Endoscopy;  Laterality: N/A;   THYROIDECTOMY  1979   TONSILLECTOMY      Current Meds  Medication Sig   amiodarone (PACERONE) 200 MG tablet Take 0.5 tablets (100 mg total) by mouth daily.  (Patient taking differently: Take 100 mg by mouth every other day.)   hydrochlorothiazide (HYDRODIURIL) 12.5 MG tablet Take 1 tablet by mouth daily.   metoprolol succinate (TOPROL-XL) 25 MG 24 hr tablet Take 1 tablet by mouth daily.   polyethylene glycol (MIRALAX / GLYCOLAX) packet Take 17 g by mouth 2 (two) times daily. Please adjust dose   SYNTHROID 125 MCG tablet Take 1 tablet by mouth daily.    Allergies  Allergen Reactions   Morphine And Related     NAUSEA & VOMITTING       Review of Systems negative except from HPI and PMH  Physical Exam BP (!) 150/80 (BP Location: Left Arm, Patient Position: Sitting, Cuff Size: Normal)   Pulse (!) 155   Ht 5' (1.524 m)   Wt 128 lb (58.1 kg)   BMI 25.00 kg/m  Well developed and nourished in no acute distress HENT normal Neck supple with JVP-flat Carotids brisk and full without bruits Clear Irregularly irregular rate and rhythm with controlled***rapid*** ventricular response, no murmurs or gallops Abd-soft with active BS without hepatomegaly No Clubbing cyanosis edema Skin-warm and dry A & Oriented  Grossly normal sensory and motor function  ECG: Sinus Rhythm***AF***  @***  Intervals  ***/***/***  Axis ***     Assessment and  Plan  Atrial fibrillation-paroxysmal  High risk medication surveillance   The patient has infrequent atrial fibrillation of which she is aware.  She is on amiodarone having down titrated herself to 100 mg a day.  Would leave her there.  She is averse to anticoagulation.  Takes a diuretic; will defer potassium follow-up to her PCP.  He is doing a great job of following amiodarone surveillance laboratories as noted above     Current medicines are reviewed at length with the patient today .  The patient does not  have concerns regarding medicines.

## 2021-03-05 NOTE — Progress Notes (Signed)
Patient Care Team: Adrian Prince, MD as PCP - General (Endocrinology)   HPI  Kristi Morales is a 87 y.o. female Seen in follow-up for recurrent infrequent episodes of atrial fibrillation which she ascribes to stress.  She has had a history of GI bleeding.  She has been refusing of anticoagulation.  She was started on amiodarone 2017-and has on her own down titrated to 100 mg a day.     The patient denies chest pain,   nocturnal dyspnea, orthopnea or peripheral edema. There have been no lightheadedness or syncope.  She has some shortness of breath when she is "out of rhythm" she is aware of when her heart is going very fast.    She states that her whole family, when they die, have died from heart attacks.   She states that she just entered normal heart rhythm, that the abnormal rhythm state is caused by stress and going to the doctor's office increases her stress.   The morning home measurement gave her Bp 141/61 and heart rate was 51 bpm.  About using blood thinners, she says that she cannot use them due to her colon bleed requiring 6 pints of blood   Patient denies symptoms of GI intolerance, sun sensitivity, neurological symptoms attributable to amiodarone.             Thromboembolic risk factors ( age-14, Gender-1) for a CHADSVASc Score of 3  DATE TEST EF   9/17 Echo   55-65 %   11/22 Echo  60-65% PA press 71 mm  MR mod BAE-severe (LA 53 ml/m2)   Date Cr K TSH LFT Hgb  5/17  3.7<<2.9   9.6  3/19     12.1  6/20 0.9   0.48 7 11.2  8/22 0.9 (4/21) 3.9 0.22 6 13.2 (8/21)      Records and Results Reviewed   Past Medical History:  Diagnosis Date   A-fib [facility])    GI bleed 07/2015   HTN (hypertension)    Thyroid disease     Past Surgical History:  Procedure Laterality Date   APPENDECTOMY     CESAREAN SECTION     COLONOSCOPY N/A 08/10/2015   Procedure: COLONOSCOPY;  Surgeon: Carman Ching, MD;  Location: [facility] ENDOSCOPY;  Service: Endoscopy;  Laterality:  N/A;   THYROIDECTOMY  1979   TONSILLECTOMY      Current Meds  Medication Sig   amiodarone (PACERONE) 200 MG tablet Take 0.5 tablets (100 mg total) by mouth daily. (Patient taking differently: Take 100 mg by mouth every other day.)   hydrochlorothiazide (HYDRODIURIL) 12.5 MG tablet Take 1 tablet by mouth daily.   metoprolol succinate (TOPROL-XL) 25 MG 24 hr tablet Take 1 tablet by mouth daily.   polyethylene glycol (MIRALAX / GLYCOLAX) packet Take 17 g by mouth 2 (two) times daily. Please adjust dose   SYNTHROID 125 MCG tablet Take 1 tablet by mouth daily.    Allergies  Allergen Reactions   Morphine And Related     NAUSEA & VOMITTING       Review of Systems negative except from HPI and PMH  Physical Exam BP (!) 150/80 (BP Location: Left Arm, Patient Position: Sitting, Cuff Size: Normal)   Pulse (!) 155   Ht 5' (1.524 m)   Wt 128 lb (58.1 kg)   BMI 25.00 kg/m  Well developed and nourished in no acute distress HENT normal Neck supple with JVP-flat Carotids brisk and full without bruits Clear Irregularly  irregular rate and rhythm with rapid ventricular response, no murmurs or gallops Abd-soft with active BS without hepatomegaly No Clubbing cyanosis edema Skin-warm and dry A & Oriented  Grossly normal sensory and motor function  ECG: AFl  @150             Intervals  -/14/34  Axis -51 Rate related LBBB     Assessment and  Plan  Atrial fibrillation-paroxysmal  High risk medication surveillance  GI bleed history  The patient is in persistent atrial flutter, likely given back to when she saw RU-PA a month ago when she was in atrial flutter with a controlled ventricular response.  She is symptomatic from this but is still in reasonably vigorous.  Her main interest is reducing risk of stroke; she is not interested in anticoagulation because of a history of GI bleed.  I have reached out to Dr. [id number] to further try to clarify the risk of this and the potential benefits.  He  thinks that anticoagulation could be started carefully.  I have also reached out to Dr. Evlyn Kanner to discuss with her she might be a candidate for watchman.  This would be the patient's preference  We discussed the morbidity of atrial fibrillation strokes and the relatively low (compared to dying) risk of death with an atrial fibrillation stroke.  She is not interested in cardioversion.  She will increase her amiodarone from 100--200 mg a day as regards surveillance laboratories were normal 8/22.      Current medicines are reviewed at length with the patient today .  The patient does not  have concerns regarding medicines.   I,Jordan Kelly,acting as a 9/22 for Neurosurgeon, MD.,have documented all relevant documentation on the behalf of Sherryl Manges, MD,as directed by  Sherryl Manges, MD while in the presence of Sherryl Manges, MD. I, Sherryl Manges, MD, have reviewed all documentation for this visit. The documentation on 03/05/21 for the exam, diagnosis, procedures, and orders are all accurate and complete.

## 2021-03-13 ENCOUNTER — Telehealth: Payer: Self-pay | Admitting: Internal Medicine

## 2021-03-13 NOTE — Telephone Encounter (Signed)
Kristi Morales is calling stating her PCP office called her yesterday to let her know it is okay for her to go through with the watchman procedure. She is requesting a callback to set it up for after the first of the year.

## 2021-03-19 NOTE — Telephone Encounter (Signed)
Spoke with pt and advised per Dr Graciela Husbands, he has consulted with Dr Lalla Brothers who reports pt does not qualify for watchman due to pt's age and history of pulmonary hypertension. Pt verbalizes understanding and thanked Charity fundraiser for the call.

## 2021-11-15 DIAGNOSIS — E039 Hypothyroidism, unspecified: Secondary | ICD-10-CM | POA: Diagnosis not present

## 2021-11-15 DIAGNOSIS — E559 Vitamin D deficiency, unspecified: Secondary | ICD-10-CM | POA: Diagnosis not present

## 2021-11-15 DIAGNOSIS — I739 Peripheral vascular disease, unspecified: Secondary | ICD-10-CM | POA: Diagnosis not present

## 2021-11-15 DIAGNOSIS — I10 Essential (primary) hypertension: Secondary | ICD-10-CM | POA: Diagnosis not present

## 2021-11-15 DIAGNOSIS — D649 Anemia, unspecified: Secondary | ICD-10-CM | POA: Diagnosis not present

## 2021-11-15 DIAGNOSIS — I4891 Unspecified atrial fibrillation: Secondary | ICD-10-CM | POA: Diagnosis not present

## 2021-11-15 DIAGNOSIS — E785 Hyperlipidemia, unspecified: Secondary | ICD-10-CM | POA: Diagnosis not present

## 2021-11-15 DIAGNOSIS — M8580 Other specified disorders of bone density and structure, unspecified site: Secondary | ICD-10-CM | POA: Diagnosis not present

## 2022-01-17 DIAGNOSIS — H8112 Benign paroxysmal vertigo, left ear: Secondary | ICD-10-CM | POA: Diagnosis not present

## 2022-01-17 DIAGNOSIS — H903 Sensorineural hearing loss, bilateral: Secondary | ICD-10-CM | POA: Diagnosis not present

## 2022-05-07 DIAGNOSIS — L2389 Allergic contact dermatitis due to other agents: Secondary | ICD-10-CM | POA: Diagnosis not present

## 2022-05-14 DIAGNOSIS — M8580 Other specified disorders of bone density and structure, unspecified site: Secondary | ICD-10-CM | POA: Diagnosis not present

## 2022-05-14 DIAGNOSIS — I739 Peripheral vascular disease, unspecified: Secondary | ICD-10-CM | POA: Diagnosis not present

## 2022-05-14 DIAGNOSIS — G459 Transient cerebral ischemic attack, unspecified: Secondary | ICD-10-CM | POA: Diagnosis not present

## 2022-05-14 DIAGNOSIS — D649 Anemia, unspecified: Secondary | ICD-10-CM | POA: Diagnosis not present

## 2022-05-14 DIAGNOSIS — E559 Vitamin D deficiency, unspecified: Secondary | ICD-10-CM | POA: Diagnosis not present

## 2022-05-14 DIAGNOSIS — I4891 Unspecified atrial fibrillation: Secondary | ICD-10-CM | POA: Diagnosis not present

## 2022-05-14 DIAGNOSIS — I10 Essential (primary) hypertension: Secondary | ICD-10-CM | POA: Diagnosis not present

## 2022-05-14 DIAGNOSIS — E039 Hypothyroidism, unspecified: Secondary | ICD-10-CM | POA: Diagnosis not present

## 2022-05-14 DIAGNOSIS — E785 Hyperlipidemia, unspecified: Secondary | ICD-10-CM | POA: Diagnosis not present

## 2022-07-04 DIAGNOSIS — H01134 Eczematous dermatitis of left upper eyelid: Secondary | ICD-10-CM | POA: Diagnosis not present

## 2022-07-04 DIAGNOSIS — Z961 Presence of intraocular lens: Secondary | ICD-10-CM | POA: Diagnosis not present

## 2022-07-04 DIAGNOSIS — H01131 Eczematous dermatitis of right upper eyelid: Secondary | ICD-10-CM | POA: Diagnosis not present

## 2022-09-02 DIAGNOSIS — H524 Presbyopia: Secondary | ICD-10-CM | POA: Diagnosis not present

## 2022-09-02 DIAGNOSIS — H52223 Regular astigmatism, bilateral: Secondary | ICD-10-CM | POA: Diagnosis not present

## 2022-09-02 DIAGNOSIS — H5213 Myopia, bilateral: Secondary | ICD-10-CM | POA: Diagnosis not present

## 2022-10-30 DIAGNOSIS — D649 Anemia, unspecified: Secondary | ICD-10-CM | POA: Diagnosis not present

## 2022-10-30 DIAGNOSIS — E785 Hyperlipidemia, unspecified: Secondary | ICD-10-CM | POA: Diagnosis not present

## 2022-10-30 DIAGNOSIS — I119 Hypertensive heart disease without heart failure: Secondary | ICD-10-CM | POA: Diagnosis not present

## 2022-10-30 DIAGNOSIS — E039 Hypothyroidism, unspecified: Secondary | ICD-10-CM | POA: Diagnosis not present

## 2022-11-06 DIAGNOSIS — K089 Disorder of teeth and supporting structures, unspecified: Secondary | ICD-10-CM | POA: Diagnosis not present

## 2022-11-06 DIAGNOSIS — M8580 Other specified disorders of bone density and structure, unspecified site: Secondary | ICD-10-CM | POA: Diagnosis not present

## 2022-11-06 DIAGNOSIS — Z0000 Encounter for general adult medical examination without abnormal findings: Secondary | ICD-10-CM | POA: Diagnosis not present

## 2022-11-06 DIAGNOSIS — Z1339 Encounter for screening examination for other mental health and behavioral disorders: Secondary | ICD-10-CM | POA: Diagnosis not present

## 2022-11-06 DIAGNOSIS — I739 Peripheral vascular disease, unspecified: Secondary | ICD-10-CM | POA: Diagnosis not present

## 2022-11-06 DIAGNOSIS — E039 Hypothyroidism, unspecified: Secondary | ICD-10-CM | POA: Diagnosis not present

## 2022-11-06 DIAGNOSIS — Z1331 Encounter for screening for depression: Secondary | ICD-10-CM | POA: Diagnosis not present

## 2022-11-06 DIAGNOSIS — I10 Essential (primary) hypertension: Secondary | ICD-10-CM | POA: Diagnosis not present

## 2023-05-03 DEATH — deceased

## 2023-07-26 ENCOUNTER — Other Ambulatory Visit: Payer: Self-pay

## 2023-07-26 ENCOUNTER — Emergency Department (HOSPITAL_COMMUNITY)

## 2023-07-26 ENCOUNTER — Encounter (HOSPITAL_COMMUNITY): Payer: Self-pay | Admitting: *Deleted

## 2023-07-26 ENCOUNTER — Inpatient Hospital Stay (HOSPITAL_COMMUNITY)
Admission: EM | Admit: 2023-07-26 | Discharge: 2023-07-27 | DRG: 562 | Discharge status: Against Medical Advice | Attending: Internal Medicine | Admitting: Internal Medicine

## 2023-07-26 DIAGNOSIS — Y92512 Supermarket, store or market as the place of occurrence of the external cause: Secondary | ICD-10-CM

## 2023-07-26 DIAGNOSIS — E890 Postprocedural hypothyroidism: Secondary | ICD-10-CM | POA: Diagnosis present

## 2023-07-26 DIAGNOSIS — J9621 Acute and chronic respiratory failure with hypoxia: Secondary | ICD-10-CM | POA: Diagnosis present

## 2023-07-26 DIAGNOSIS — I110 Hypertensive heart disease with heart failure: Secondary | ICD-10-CM | POA: Diagnosis present

## 2023-07-26 DIAGNOSIS — Z23 Encounter for immunization: Secondary | ICD-10-CM | POA: Diagnosis present

## 2023-07-26 DIAGNOSIS — I480 Paroxysmal atrial fibrillation: Secondary | ICD-10-CM | POA: Diagnosis present

## 2023-07-26 DIAGNOSIS — I4891 Unspecified atrial fibrillation: Secondary | ICD-10-CM | POA: Diagnosis present

## 2023-07-26 DIAGNOSIS — S42401A Unspecified fracture of lower end of right humerus, initial encounter for closed fracture: Principal | ICD-10-CM | POA: Diagnosis present

## 2023-07-26 DIAGNOSIS — Z885 Allergy status to narcotic agent status: Secondary | ICD-10-CM | POA: Diagnosis not present

## 2023-07-26 DIAGNOSIS — I10 Essential (primary) hypertension: Secondary | ICD-10-CM | POA: Diagnosis not present

## 2023-07-26 DIAGNOSIS — M25521 Pain in right elbow: Secondary | ICD-10-CM | POA: Diagnosis present

## 2023-07-26 DIAGNOSIS — I5033 Acute on chronic diastolic (congestive) heart failure: Secondary | ICD-10-CM | POA: Diagnosis present

## 2023-07-26 DIAGNOSIS — Z79899 Other long term (current) drug therapy: Secondary | ICD-10-CM | POA: Diagnosis not present

## 2023-07-26 DIAGNOSIS — W0110XA Fall on same level from slipping, tripping and stumbling with subsequent striking against unspecified object, initial encounter: Secondary | ICD-10-CM | POA: Diagnosis present

## 2023-07-26 DIAGNOSIS — S01111A Laceration without foreign body of right eyelid and periocular area, initial encounter: Secondary | ICD-10-CM | POA: Diagnosis present

## 2023-07-26 DIAGNOSIS — Z79890 Hormone replacement therapy (postmenopausal): Secondary | ICD-10-CM | POA: Diagnosis not present

## 2023-07-26 DIAGNOSIS — E039 Hypothyroidism, unspecified: Secondary | ICD-10-CM | POA: Diagnosis present

## 2023-07-26 DIAGNOSIS — W19XXXA Unspecified fall, initial encounter: Principal | ICD-10-CM

## 2023-07-26 DIAGNOSIS — Z66 Do not resuscitate: Secondary | ICD-10-CM | POA: Diagnosis present

## 2023-07-26 LAB — CBC WITH DIFFERENTIAL/PLATELET
Abs Immature Granulocytes: 0.03 10*3/uL (ref 0.00–0.07)
Basophils Absolute: 0 10*3/uL (ref 0.0–0.1)
Basophils Relative: 0 %
Eosinophils Absolute: 0 10*3/uL (ref 0.0–0.5)
Eosinophils Relative: 0 %
HCT: 37.5 % (ref 36.0–46.0)
Hemoglobin: 12.2 g/dL (ref 12.0–15.0)
Immature Granulocytes: 0 %
Lymphocytes Relative: 7 %
Lymphs Abs: 0.7 10*3/uL (ref 0.7–4.0)
MCH: 33.2 pg (ref 26.0–34.0)
MCHC: 32.5 g/dL (ref 30.0–36.0)
MCV: 102.2 fL — ABNORMAL HIGH (ref 80.0–100.0)
Monocytes Absolute: 0.5 10*3/uL (ref 0.1–1.0)
Monocytes Relative: 6 %
Neutro Abs: 8.3 10*3/uL — ABNORMAL HIGH (ref 1.7–7.7)
Neutrophils Relative %: 87 %
Platelets: 196 10*3/uL (ref 150–400)
RBC: 3.67 MIL/uL — ABNORMAL LOW (ref 3.87–5.11)
RDW: 13.7 % (ref 11.5–15.5)
WBC: 9.6 10*3/uL (ref 4.0–10.5)
nRBC: 0 % (ref 0.0–0.2)

## 2023-07-26 LAB — BASIC METABOLIC PANEL WITH GFR
Anion gap: 13 (ref 5–15)
BUN: 17 mg/dL (ref 8–23)
CO2: 22 mmol/L (ref 22–32)
Calcium: 8.5 mg/dL — ABNORMAL LOW (ref 8.9–10.3)
Chloride: 103 mmol/L (ref 98–111)
Creatinine, Ser: 0.98 mg/dL (ref 0.44–1.00)
GFR, Estimated: 54 mL/min — ABNORMAL LOW (ref >60)
Glucose, Bld: 124 mg/dL — ABNORMAL HIGH (ref 70–99)
Potassium: 4 mmol/L (ref 3.5–5.1)
Sodium: 138 mmol/L (ref 135–145)

## 2023-07-26 LAB — BRAIN NATRIURETIC PEPTIDE: B Natriuretic Peptide: 420.1 pg/mL — ABNORMAL HIGH (ref 0.0–100.0)

## 2023-07-26 LAB — TROPONIN I (HIGH SENSITIVITY): Troponin I (High Sensitivity): 12 ng/L (ref <18)

## 2023-07-26 MED ORDER — FUROSEMIDE 10 MG/ML IJ SOLN
40.0000 mg | Freq: Once | INTRAMUSCULAR | Status: AC
  Administered 2023-07-26: 40 mg via INTRAVENOUS
  Filled 2023-07-26: qty 4

## 2023-07-26 MED ORDER — FUROSEMIDE 10 MG/ML IJ SOLN
20.0000 mg | Freq: Once | INTRAMUSCULAR | Status: DC
Start: 2023-07-26 — End: 2023-07-26

## 2023-07-26 MED ORDER — TETANUS-DIPHTH-ACELL PERTUSSIS 5-2.5-18.5 LF-MCG/0.5 IM SUSY
0.5000 mL | PREFILLED_SYRINGE | Freq: Once | INTRAMUSCULAR | Status: AC
  Administered 2023-07-26: 0.5 mL via INTRAMUSCULAR
  Filled 2023-07-26: qty 0.5

## 2023-07-26 MED ORDER — BACITRACIN ZINC 500 UNIT/GM EX OINT
TOPICAL_OINTMENT | Freq: Two times a day (BID) | CUTANEOUS | Status: DC
  Filled 2023-07-26: qty 0.9
  Filled 2023-07-26: qty 28.4

## 2023-07-26 MED ORDER — LIDOCAINE-EPINEPHRINE (PF) 2 %-1:200000 IJ SOLN
10.0000 mL | Freq: Once | INTRAMUSCULAR | Status: AC
  Administered 2023-07-26: 10 mL via INTRADERMAL
  Filled 2023-07-26: qty 20

## 2023-07-26 NOTE — Assessment & Plan Note (Signed)
 -  Check TSH continue home medications Synthroid at  125 mcg po q day

## 2023-07-26 NOTE — ED Notes (Signed)
 Family updated on plan of care

## 2023-07-26 NOTE — Assessment & Plan Note (Signed)
 Hold hydrochlorothiazide but continue metoprolol 25 mg daily

## 2023-07-26 NOTE — Discharge Instructions (Addendum)
 You have been seen in the Emergency Department (ED) today for a laceration (cut) from a fall.  Please keep the cut clean but do not submerge it in the water.  It has been repaired with 9 staples or sutures that will need to be removed in about Face - 5 days. Please follow up with your doctor, an urgent care, or return to the ED for suture removal.    Your elbow x-ray shows a possible fracture and we have placed in a splint.  If you begin to have numbness, tingling, pain not controlled medications, skin color changes or other worsening of symptoms I recommend that you return to the ER.  I have given you an orthopedist to follow-up with as well.  Please take Tylenol  (acetaminophen ) as needed for discomfort as written on the box.   Please follow up with your doctor as soon as possible regarding today's emergent visit.   Return to the ED or call your doctor if you notice any signs of infection such as fever, increased pain, increased redness, pus, or other symptoms that concern you.

## 2023-07-26 NOTE — ED Provider Notes (Signed)
 Assume Care Note  Vitals  BP (!) 165/68   Pulse 68   Temp 98.3 F (36.8 C) (Oral)   Resp 16   Ht 5' (1.524 m)   Wt 58.1 kg   SpO2 95%   BMI 25.02 kg/m    ED Course / MDM   Clinical Course as of 07/26/23 2334  Sat Jul 26, 2023  2053 Hx a fib, pw mechanical fall, lac repaired, r elbow pain, elbow frx in long splint (CT for ortho), hypoxic on tele but no other sx [ ]  fu labs, scans, hypoxia [AO]    Clinical Course User Index [AO] Lorain Robson, MD   Medical Decision Making Amount and/or Complexity of Data Reviewed Labs: ordered. Radiology: ordered.  Risk OTC drugs. Prescription drug management.   I reviewed patient's workup, which resulted with BNP of 420, normal troponin.  Chest x-ray with cardiomegaly and bilateral pulmonary edema.  She was given 40 mg of Lasix.  Given hypoxia with new oxygen  requirement, patient requires admission for further monitoring and management.  Handoff was given to admitting team.  Patient seen in conjunction with Dr. Isaiah Marc, who agreed with the above work-up and plan of care.       Lorain Robson, MD 07/26/23 4782    Mordecai Applebaum, MD 07/27/23 1209

## 2023-07-26 NOTE — ED Notes (Signed)
 Off floor to CT

## 2023-07-26 NOTE — ED Notes (Signed)
 Lac cleaned with soap and water dsd applied

## 2023-07-26 NOTE — Assessment & Plan Note (Signed)
-   Pt diagnosed with CHF based on presence of the following: , rales on exam, JVD, cardiomegaly, Pulmonary edema on CXR,  pleural effusion  With noted response to IV diuretic in ER  admit on telemetry,  cycle cardiac enzymes,   Recent Labs    07/26/23 2123  TROPONINIHS 12     obtain serial ECG  to evaluate for ischemia as a cause of heart failure  monitor daily weight:  Filed Weights   07/26/23 1747  Weight: 58.1 kg   Last BNP BNP (last 3 results) Recent Labs    07/26/23 2123  BNP 420.1*       diurese with IV lasix   20 mg  Daily and monitor orthostatics and creatinine to avoid over diuresis.  Order echogram to evaluate EF and valves

## 2023-07-26 NOTE — Assessment & Plan Note (Signed)
 this patient has acute respiratory failure with Hypoxia  as documented by the presence of following: O2 saturatio< 90% on RA   Likely due to:  CHF exacerbation,    Continuous pulse ox   check Pulse ox with ambulation prior to discharge   may need  TC consult for home O2 set up    flutter valve ordered

## 2023-07-26 NOTE — H&P (Signed)
 Kristi Morales NWG:[id number] DOB: 11-03-1927 DOA: 07/26/2023     PCP: Rosslyn Coons, MD   Outpatient Specialists:  CARDS:   Dr.  Rodolfo Clan  NEphrology: *  Dr. No care team member to display  NEurology *   Dr. Pulmonary *  Dr.  Oncology * Dr.No care team member to display  GI* Dr.  Cherene Core, LB) No care team member to display Urology Dr. *  Patient arrived to ER on 07/26/23 at 1730 Referred by Attending Mordecai Applebaum, MD   Patient coming from:    home Lives alone,   *** With family     Chief Complaint:   Chief Complaint  Patient presents with   Fall    HPI: Kristi Morales is a 89 y.o. female with medical history significant of A.fib, Gi bleeding, HTN, Hypothyrodism    Presented with   fall Patient had a fall in grocery store no loss of consciousness did hit her head with 2-3 inches laceration from the right eyebrow and bleeding History of A-fib but not on [facility] This was a mechanical fall carrying groceries tripped over her feet landed hit headfirst.  Denies any neck pain.  Able to ambulate no new weakness paresthesias or visual changes.  Neurovascularly intact tetanus up dated however laceration was repaired She was also reporting right elbow pain imaging shows transcondylar fracture of the right elbow placed on long-arm splint Patient was found to be 87% on room air and have some crackles on exam.  Chest x-ray was ordered    Denies significant ETOH intake *** Does not smoke*** but interested in quitting***  Denies marijuana use ***    Regarding pertinent Chronic problems:       HTN on Toprol and hydrochlorothiazide   chronic CHF diastolic  - last echo  Recent Results (from the past 57846 hours)  ECHOCARDIOGRAM COMPLETE   Collection Time: 02/27/21  1:00 PM  Result Value   Area-P 1/2 4.49   S' Lateral 2.40   MV VTI 0.03   Radius 0.70   MV M vel 5.96   MV Peak grad 142.1   Narrative      ECHOCARDIOGRAM REPORT      IMPRESSIONS    1. Left  ventricular ejection fraction, by estimation, is 60 to 65%. The left ventricle has normal function. The left ventricle has no regional wall motion abnormalities. Left ventricular diastolic parameters are consistent with Grade II diastolic  dysfunction (pseudonormalization).  2. Right ventricular systolic function is normal. The right ventricular size is normal. There is severely elevated pulmonary artery systolic pressure. The estimated right ventricular systolic pressure is 70.9 mmHg.  3. Left atrial size was severely dilated.  4. Right atrial size was severely dilated.  5. MR PISA - 0.7 cm     MR ERO - 0.16 cm2     MR Vol - 32 ml. The mitral valve is normal in structure. Moderate mitral valve regurgitation. No evidence of mitral stenosis.  6. Tricuspid valve regurgitation is mild to moderate.  7. The aortic valve is normal in structure. There is moderate calcification of the aortic valve. There is moderate thickening of the aortic valve. Aortic valve regurgitation is not visualized. Aortic valve sclerosis/calcification is present, without any  evidence of aortic stenosis.  8. Pulmonic valve regurgitation is moderate.  9. The inferior vena cava is normal in size with greater than 50% respiratory variability, suggesting right atrial pressure of 3 mmHg.         ***  CAD  - On Aspirin, statin, betablocker, Plavix                 - *followed by cardiology                - last cardiac cath        Hypothyroidism:   Lab Results  Component Value Date   TSH 0.130 (L) 08/08/2015   on synthroid     *** COPD - not **followed by pulmonology *** not  on baseline oxygen   *L,      A. Fib -   atrial fibrillation CHA2DS2 vas score  4     Not on anticoagulation secondary to Risk of Falls,   recurrent bleeding         -  Rate control:  Currently controlled with  Toprolol,           - Rhythm control:   amiodarone       While in ER: Clinical Course as of 07/26/23 2329  Sat Jul 26, 2023  2053 Hx a  fib, pw mechanical fall, lac repaired, r elbow pain, elbow frx in long splint (CT for ortho), hypoxic on tele but no other sx [ ]  fu labs, scans, hypoxia [AO]    Clinical Course User Index [AO] Lorain Robson, MD     Given lasix 40 mg IV     Lab Orders         CBC with Differential         Basic metabolic panel         Brain natriuretic peptide      CT HEAD/cervical/maxillofacial NON acute Right elbow suspected nondisplaced transcondylar fracture  CT elbow Acute extra-articular nondisplaced medial supracondylar humeral fracture  CXR - CHF   Following Medications were ordered in ER: Medications  bacitracin ointment ( Topical Given 07/26/23 2104)  furosemide (LASIX) injection 20 mg (has no administration in time range)  lidocaine-EPINEPHrine (XYLOCAINE W/EPI) 2 %-1:200000 (PF) injection 10 mL (10 mLs Intradermal Given 07/26/23 1800)  Tdap (BOOSTRIX) injection 0.5 mL (0.5 mLs Intramuscular Given 07/26/23 1930)    ______    ED Triage Vitals  Encounter Vitals Group     BP 07/26/23 1736 (!) 204/86     Systolic BP Percentile --      Diastolic BP Percentile --      Pulse Rate 07/26/23 1900 70     Resp 07/26/23 1736 18     Temp 07/26/23 1736 97.6 F (36.4 C)     Temp Source 07/26/23 2131 Oral     SpO2 07/26/23 1736 96 %     Weight 07/26/23 1747 128 lb 1.4 oz (58.1 kg)     Height 07/26/23 1747 5' (1.524 m)     Head Circumference --      Peak Flow --      Pain Score 07/26/23 1743 2     Pain Loc --      Pain Education --      Exclude from Growth Chart --   EGBT(51)@     _________________________________________ Significant initial  Findings: Abnormal Labs Reviewed  CBC WITH DIFFERENTIAL/PLATELET - Abnormal; Notable for the following components:      Result Value   RBC 3.67 (*)    MCV 102.2 (*)    Neutro Abs 8.3 (*)    All other components within normal limits  BASIC METABOLIC PANEL WITH GFR - Abnormal; Notable for the following components:   Glucose, Bld 124 (*)  Calcium  8.5 (*)    GFR, Estimated 54 (*)    All other components within normal limits  BRAIN NATRIURETIC PEPTIDE - Abnormal; Notable for the following components:   B Natriuretic Peptide 420.1 (*)    All other components within normal limits      _________________________ Troponin  ordered Cardiac Panel (last 3 results) Recent Labs    07/26/23 2123  TROPONINIHS 12     ECG: Ordered Personally reviewed and interpreted by me showing: HR : 64 Rhythm:Sinus rhythm Atrial premature complex Nonspecific intraventricular conduction delay When compared with ECG of 08/08/2015, Sinus rhythm has replaced Atrial fibrillation T wave abnormality is no longer Inappropriate inhibition of pacing in atrium QTC 484  BNP (last 3 results) Recent Labs    07/26/23 2123  BNP 420.1*      The recent clinical data is shown below. Vitals:   07/26/23 2134 07/26/23 2136 07/26/23 2137 07/26/23 2138  BP:      Pulse: 64 66 65 70  Resp: 13 18 (!) 21 16  Temp:      TempSrc:      SpO2: 91% 92% 91% 94%  Weight:      Height:        WBC     Component Value Date/Time   WBC 9.6 07/26/2023 2123   LYMPHSABS 0.7 07/26/2023 2123   MONOABS 0.5 07/26/2023 2123   EOSABS 0.0 07/26/2023 2123   BASOSABS 0.0 07/26/2023 2123         UA   not ordered    Results for orders placed or performed during the hospital encounter of 08/08/15  MRSA PCR Screening     Status: None   Collection Time: 08/08/15  5:50 AM   Specimen: Nasal Mucosa; Nasopharyngeal  Result Value Ref Range Status   MRSA by PCR NEGATIVE NEGATIVE Final    Comment:        The GeneXpert MRSA Assay (FDA approved for NASAL specimens only), is one component of a comprehensive MRSA colonization surveillance program. It is not intended to diagnose MRSA infection nor to guide or monitor treatment for MRSA infections.        __________________________________________________________ Recent Labs  Lab 07/26/23 2123  NA 138  K 4.0  CO2 22   GLUCOSE 124*  BUN 17  CREATININE 0.98  CALCIUM  8.5*    Cr   stable,   Lab Results  Component Value Date   CREATININE 0.98 07/26/2023   CREATININE 0.70 08/11/2015   CREATININE 0.81 08/10/2015    No results for input(s): "AST", "ALT", "ALKPHOS", "BILITOT", "PROT", "ALBUMIN" in the last 168 hours. Lab Results  Component Value Date   CALCIUM  8.5 (L) 07/26/2023    Plt: Lab Results  Component Value Date   PLT 196 07/26/2023       Recent Labs  Lab 07/26/23 2123  WBC 9.6  NEUTROABS 8.3*  HGB 12.2  HCT 37.5  MCV 102.2*  PLT 196    HG/HCT   stable,       Component Value Date/Time   HGB 12.2 07/26/2023 2123   HCT 37.5 07/26/2023 2123   MCV 102.2 (H) 07/26/2023 2123      _______________________________________________ Hospitalist was called for admission for   Fall, initial encounter    Acute on chronic diastolic CHF  The following Work up has been ordered so far:  Orders Placed This Encounter  Procedures   LACERATION REPAIR   CT Head Wo Contrast   CT Cervical Spine Wo Contrast  CT Maxillofacial WO CM   DG Elbow Complete Right   CT Elbow Right Wo Contrast   DG Chest Port 1 View   CBC with Differential   Basic metabolic panel   Brain natriuretic peptide   Apply dressing   Apply splint long arm   ED Cardiac monitoring   Consult for [facility] Admission   ED EKG   EKG 12-Lead     OTHER Significant initial  Findings:  labs showing:          Cultures: No results found for: "SDES", "SPECREQUEST", "CULT", "REPTSTATUS"   Radiological Exams on Admission: DG Chest Port 1 View Result Date: 07/26/2023 CLINICAL DATA:  Hypoxia EXAM: PORTABLE CHEST 1 VIEW COMPARISON:  08/08/2015 FINDINGS: Single frontal view of the chest demonstrates an enlarged cardiac silhouette. There is increased pulmonary vascular congestion, with diffuse interstitial prominence and mild bibasilar ground-glass airspace disease consistent with developing edema. Trace bilateral  effusions. No pneumothorax. IMPRESSION: 1. Congestive heart failure, with developing bilateral pulmonary edema. Electronically Signed   By: Bobbye Burrow M.D.   On: 07/26/2023 22:47   CT Elbow Right Wo Contrast Result Date: 07/26/2023 CLINICAL DATA:  suspected fracture EXAM: CT OF THE UPPER RIGHT EXTREMITY WITHOUT CONTRAST TECHNIQUE: Multidetector CT imaging of the upper right extremity was performed according to the standard protocol. RADIATION DOSE REDUCTION: This exam was performed according to the departmental dose-optimization program which includes automated exposure control, adjustment of the mA and/or kV according to patient size and/or use of iterative reconstruction technique. COMPARISON:  X-ray right elbow 07/26/2023 FINDINGS: Bones/Joint/Cartilage Acute extra-articular nondisplaced medial supracondylar humeral fracture (6:28, 8:21-23). No other fracture identified. No dislocation. Ligaments Suboptimally assessed by CT. Muscles and Tendons Grossly unremarkable. Soft tissues Mild dorsal subcutaneus soft tissue edema. IMPRESSION: Acute extra-articular nondisplaced medial supracondylar humeral fracture Electronically Signed   By: Morgane  Naveau M.D.   On: 07/26/2023 21:09   CT Head Wo Contrast Result Date: 07/26/2023 CLINICAL DATA:  lac on right eyebrow; fall EXAM: CT HEAD WITHOUT CONTRAST CT MAXILLOFACIAL WITHOUT CONTRAST CT CERVICAL SPINE WITHOUT CONTRAST TECHNIQUE: Multidetector CT imaging of the head, cervical spine, and maxillofacial structures were performed using the standard protocol without intravenous contrast. Multiplanar CT image reconstructions of the cervical spine and maxillofacial structures were also generated. RADIATION DOSE REDUCTION: This exam was performed according to the departmental dose-optimization program which includes automated exposure control, adjustment of the mA and/or kV according to patient size and/or use of iterative reconstruction technique. COMPARISON:  None  Available. FINDINGS: CT HEAD FINDINGS Brain: Patchy and confluent areas of decreased attenuation are noted throughout the deep and periventricular white matter of the cerebral hemispheres bilaterally, compatible with chronic microvascular ischemic disease. No evidence of large-territorial acute infarction. No parenchymal hemorrhage. No mass lesion. No extra-axial collection. No mass effect or midline shift. No hydrocephalus. Basilar cisterns are patent. Vascular: No hyperdense vessel. Atherosclerotic calcifications are present within the cavernous internal carotid and vertebral arteries. Skull: No acute fracture or focal lesion. Other: None. CT MAXILLOFACIAL FINDINGS Osseous: No fracture or mandibular dislocation. No destructive process. Bilateral temporomandibular joint degenerative changes. Prior surgical hardware repair of the left orbital floor. Tooth implants noted both along the maxilla and mandible. Patient is edentulous. Sinuses/Orbits: Right sphenoid sinus mucosal thickening. Otherwise paranasal sinuses and mastoid air cells are clear. Bilateral lens replacement. Otherwise the orbits are unremarkable. Soft tissues: Right periorbital subcutaneus soft tissue hematoma measuring up to 7 mm extending to the right nasal bridge. CT CERVICAL SPINE FINDINGS Alignment: Normal. Skull  base and vertebrae: Diffusely decreased bone density. Multilevel mild degenerative changes of the spine. No acute fracture. No aggressive appearing focal osseous lesion or focal pathologic process. Soft tissues and spinal canal: No prevertebral fluid or swelling. No visible canal hematoma. Upper chest: Biapical emphysematous changes. Other: None. IMPRESSION: 1. No acute intracranial abnormality. 2.  No acute displaced facial fracture. 3. No acute displaced fracture or traumatic listhesis of the cervical spine. 4. Diffusely decreased bone density. 5.  Emphysema (ICD10-J43.9). Electronically Signed   By: Morgane  Naveau M.D.   On:  07/26/2023 20:58   CT Cervical Spine Wo Contrast Result Date: 07/26/2023 CLINICAL DATA:  lac on right eyebrow; fall EXAM: CT HEAD WITHOUT CONTRAST CT MAXILLOFACIAL WITHOUT CONTRAST CT CERVICAL SPINE WITHOUT CONTRAST TECHNIQUE: Multidetector CT imaging of the head, cervical spine, and maxillofacial structures were performed using the standard protocol without intravenous contrast. Multiplanar CT image reconstructions of the cervical spine and maxillofacial structures were also generated. RADIATION DOSE REDUCTION: This exam was performed according to the departmental dose-optimization program which includes automated exposure control, adjustment of the mA and/or kV according to patient size and/or use of iterative reconstruction technique. COMPARISON:  None Available. FINDINGS: CT HEAD FINDINGS Brain: Patchy and confluent areas of decreased attenuation are noted throughout the deep and periventricular white matter of the cerebral hemispheres bilaterally, compatible with chronic microvascular ischemic disease. No evidence of large-territorial acute infarction. No parenchymal hemorrhage. No mass lesion. No extra-axial collection. No mass effect or midline shift. No hydrocephalus. Basilar cisterns are patent. Vascular: No hyperdense vessel. Atherosclerotic calcifications are present within the cavernous internal carotid and vertebral arteries. Skull: No acute fracture or focal lesion. Other: None. CT MAXILLOFACIAL FINDINGS Osseous: No fracture or mandibular dislocation. No destructive process. Bilateral temporomandibular joint degenerative changes. Prior surgical hardware repair of the left orbital floor. Tooth implants noted both along the maxilla and mandible. Patient is edentulous. Sinuses/Orbits: Right sphenoid sinus mucosal thickening. Otherwise paranasal sinuses and mastoid air cells are clear. Bilateral lens replacement. Otherwise the orbits are unremarkable. Soft tissues: Right periorbital subcutaneus soft  tissue hematoma measuring up to 7 mm extending to the right nasal bridge. CT CERVICAL SPINE FINDINGS Alignment: Normal. Skull base and vertebrae: Diffusely decreased bone density. Multilevel mild degenerative changes of the spine. No acute fracture. No aggressive appearing focal osseous lesion or focal pathologic process. Soft tissues and spinal canal: No prevertebral fluid or swelling. No visible canal hematoma. Upper chest: Biapical emphysematous changes. Other: None. IMPRESSION: 1. No acute intracranial abnormality. 2.  No acute displaced facial fracture. 3. No acute displaced fracture or traumatic listhesis of the cervical spine. 4. Diffusely decreased bone density. 5.  Emphysema (ICD10-J43.9). Electronically Signed   By: Morgane  Naveau M.D.   On: 07/26/2023 20:58   CT Maxillofacial WO CM Result Date: 07/26/2023 CLINICAL DATA:  lac on right eyebrow; fall EXAM: CT HEAD WITHOUT CONTRAST CT MAXILLOFACIAL WITHOUT CONTRAST CT CERVICAL SPINE WITHOUT CONTRAST TECHNIQUE: Multidetector CT imaging of the head, cervical spine, and maxillofacial structures were performed using the standard protocol without intravenous contrast. Multiplanar CT image reconstructions of the cervical spine and maxillofacial structures were also generated. RADIATION DOSE REDUCTION: This exam was performed according to the departmental dose-optimization program which includes automated exposure control, adjustment of the mA and/or kV according to patient size and/or use of iterative reconstruction technique. COMPARISON:  None Available. FINDINGS: CT HEAD FINDINGS Brain: Patchy and confluent areas of decreased attenuation are noted throughout the deep and periventricular white matter of the cerebral hemispheres bilaterally,  compatible with chronic microvascular ischemic disease. No evidence of large-territorial acute infarction. No parenchymal hemorrhage. No mass lesion. No extra-axial collection. No mass effect or midline shift. No  hydrocephalus. Basilar cisterns are patent. Vascular: No hyperdense vessel. Atherosclerotic calcifications are present within the cavernous internal carotid and vertebral arteries. Skull: No acute fracture or focal lesion. Other: None. CT MAXILLOFACIAL FINDINGS Osseous: No fracture or mandibular dislocation. No destructive process. Bilateral temporomandibular joint degenerative changes. Prior surgical hardware repair of the left orbital floor. Tooth implants noted both along the maxilla and mandible. Patient is edentulous. Sinuses/Orbits: Right sphenoid sinus mucosal thickening. Otherwise paranasal sinuses and mastoid air cells are clear. Bilateral lens replacement. Otherwise the orbits are unremarkable. Soft tissues: Right periorbital subcutaneus soft tissue hematoma measuring up to 7 mm extending to the right nasal bridge. CT CERVICAL SPINE FINDINGS Alignment: Normal. Skull base and vertebrae: Diffusely decreased bone density. Multilevel mild degenerative changes of the spine. No acute fracture. No aggressive appearing focal osseous lesion or focal pathologic process. Soft tissues and spinal canal: No prevertebral fluid or swelling. No visible canal hematoma. Upper chest: Biapical emphysematous changes. Other: None. IMPRESSION: 1. No acute intracranial abnormality. 2.  No acute displaced facial fracture. 3. No acute displaced fracture or traumatic listhesis of the cervical spine. 4. Diffusely decreased bone density. 5.  Emphysema (ICD10-J43.9). Electronically Signed   By: Morgane  Naveau M.D.   On: 07/26/2023 20:58   DG Elbow Complete Right Result Date: 07/26/2023 CLINICAL DATA:  Fall EXAM: RIGHT ELBOW - COMPLETE 3+ VIEW COMPARISON:  None Available. FINDINGS: Subtle linear lucency is seen within the epicondyles suggesting a nondisplaced transcondylar fracture. Right elbow effusion is present. Normal overall alignment. No dislocation. Joint spaces are preserved. IMPRESSION: 1. Suspected nondisplaced  transcondylar fracture. This could be confirmed with CT or MRI imaging. 2. Right elbow effusion. Electronically Signed   By: Worthy Heads M.D.   On: 07/26/2023 19:51   _______________________________________________________________________________________________________ Latest  Blood pressure (!) 168/60, pulse 70, temperature 98.3 F (36.8 C), temperature source Oral, resp. rate 16, height 5' (1.524 m), weight 58.1 kg, SpO2 94%.   Vitals  labs and radiology finding personally reviewed  Review of Systems:    Pertinent positives include:    shortness of breath at rest  dyspnea on exertion, Constitutional:  No weight loss, night sweats, Fevers, chills, fatigue, weight loss  HEENT:  No headaches, Difficulty swallowing,Tooth/dental problems,Sore throat,  No sneezing, itching, ear ache, nasal congestion, post nasal drip,  Cardio-vascular:  No chest pain, Orthopnea, PND, anasarca, dizziness, palpitations.no Bilateral lower extremity swelling  GI:  No heartburn, indigestion, abdominal pain, nausea, vomiting, diarrhea, change in bowel habits, loss of appetite, melena, blood in stool, hematemesis Resp:  no No excess mucus, no productive cough, No non-productive cough, No coughing up of blood.No change in color of mucus.No wheezing. Skin:  no rash or lesions. No jaundice GU:  no dysuria, change in color of urine, no urgency or frequency. No straining to urinate.  No flank pain.  Musculoskeletal:  No joint pain or no joint swelling. No decreased range of motion. No back pain.  Psych:  No change in mood or affect. No depression or anxiety. No memory loss.  Neuro: no localizing neurological complaints, no tingling, no weakness, no double vision, no gait abnormality, no slurred speech, no confusion  All systems reviewed and apart from HOPI all are negative _______________________________________________________________________________________________ Past Medical History:   Past Medical  History:  Diagnosis Date   A-fib (HCC)    GI  bleed 07/2015   HTN (hypertension)    Thyroid  disease      Past Surgical History:  Procedure Laterality Date   APPENDECTOMY     CESAREAN SECTION     COLONOSCOPY N/A 08/10/2015   Procedure: COLONOSCOPY;  Surgeon: Jolinda Necessary, MD;  Location: [facility] ENDOSCOPY;  Service: Endoscopy;  Laterality: N/A;   THYROIDECTOMY  1979   TONSILLECTOMY      Social History:  Ambulatory   independently      reports that she has never smoked. She has never used smokeless tobacco. She reports that she does not drink alcohol  and does not use drugs.   Family History:   Family History  Problem Relation Age of Onset   Cancer Neg Hx    Diabetes Neg Hx    Heart failure Neg Hx    Hyperlipidemia Neg Hx    ______________________________________________________________________________________________ Allergies: Allergies  Allergen Reactions   Morphine And Codeine     NAUSEA & VOMITTING      Prior to Admission medications   Medication Sig Start Date End Date Taking? Authorizing Provider  amiodarone  (PACERONE ) 200 MG tablet Take 0.5 tablets (100 mg total) by mouth daily. Patient taking differently: Take 100 mg by mouth every other day. 07/02/18   Verona Goodwill, MD  hydrochlorothiazide (HYDRODIURIL) 12.5 MG tablet Take 1 tablet by mouth daily. 04/23/18   [provider]  metoprolol succinate (TOPROL-XL) 25 MG 24 hr tablet Take 1 tablet by mouth daily.    [provider]  polyethylene glycol (MIRALAX  / GLYCOLAX ) packet Take 17 g by mouth 2 (two) times daily. Please adjust dose 08/11/15   Rai, Hurman Maiden, MD  SYNTHROID  125 MCG tablet Take 1 tablet by mouth daily. 06/12/18   [provider]    ___________________________________________________________________________________________________ Physical Exam:    07/26/2023    9:38 PM 07/26/2023    9:37 PM 07/26/2023    9:36 PM  Vitals with BMI  Pulse 70 65 66     1. General:  in No   Acute distress   Chronically ill   -appearing 2. Psychological: Alert and   Oriented 3. Head/ENT:    Dry Mucous Membranes                          Head   traumatic, Facial laceration repairedneck supple                          Poor Dentition 4. SKIN: normal  Skin turgor,  Skin clean Dry and intact no rash    5. Heart: Regular rate and rhythm no*** Murmur, no Rub or gallop 6. Lungs: no wheezes some crackles   7. Abdomen: Soft, ***non-tender, Non distended *** obese ***bowel sounds present 8. Lower extremities: no clubbing, cyanosis, no ***edema 9. Neurologically Grossly intact, moving all 4 extremities equally   10. MSK: Normal range of motion    Chart has been reviewed  ______________________________________________________________________________________________  Assessment/Plan  89 y.o. female with medical history significant of A.fib, Gi bleeding, HTN, Hypothyrodism  Admitted for   Fall, acute on chronic diastolic CHF  Present on Admission:  Acute on chronic diastolic CHF (congestive heart failure) (HCC)  ATRIAL FIBRILLATION  HYPERTENSION, BENIGN  Hypothyroidism     Acute on chronic diastolic CHF (congestive heart failure) (HCC) - Pt diagnosed with CHF based on presence of the following: , rales on exam, JVD, cardiomegaly, Pulmonary edema on CXR,  ,  pleural effusion  With noted response to IV diuretic in ER  admit on telemetry,  cycle cardiac enzymes,   Recent Labs    07/26/23 2123  TROPONINIHS 12     obtain serial ECG  to evaluate for ischemia as a cause of heart failure  monitor daily weight:  Filed Weights   07/26/23 1747  Weight: 58.1 kg   Last BNP BNP (last 3 results) Recent Labs    07/26/23 2123  BNP 420.1*       diurese with IV lasix   40 mg  Daily and monitor orthostatics and creatinine to avoid over diuresis.  Order echogram to evaluate EF and valves      ATRIAL FIBRILLATION Stable Not on anticoagulation given history of GI bleed and  falls. Continue Toprol continue amiodarone   HYPERTENSION, BENIGN Hold hydrochlorothiazide but continue metoprolol 25 mg daily  Hypothyroidism - Check TSH continue home medications Synthroid  at 125 mcg po q day    Other plan as per orders.  DVT prophylaxis:  SCD      Code Status:    Code Status: Prior FULL CODE *** DNR/DNI ***comfort care as per patient ***family  I had personally discussed CODE STATUS with patient and family*  ACP   none   Family Communication:   Family not at  Bedside  plan of care was discussed on the phone with *** Son, Daughter, Wife, Husband, Sister, Brother , father, mother  Diet    Disposition Plan:        To home once workup is complete and patient is stable   Following barriers for discharge:                                                         Pain controlled with PO medications                                                         Will likely need home health, home O2, set up                                                Would benefit from PT/OT eval prior to DC  Ordered                    Consults called: none     Admission status:  ED Disposition     ED Disposition  Admit   Condition  --   Comment  Hospital Area: [facility] [[id number]]  Level of Care: Telemetry Cardiac [[id number]]  May admit patient to [facility] or [facility] Smalling if equivalent level of care is available:: No  Covid Evaluation: Asymptomatic - no recent exposure (last 10 days) testing not required  Diagnosis: Acute on chronic diastolic CHF (congestive heart failure) [facility]) [[id number]]  Admitting Physician: Ilir Mahrt [[id number]]  Attending Physician: Legion Discher [[id number]]  Certification:: I certify this patient will need inpatient services for at least 2 midnights  inpatient     I Expect 2 midnight stay secondary to severity of patient's current illness need for inpatient interventions justified by the following:  hemodynamic  instability despite optimal treatment ( hypoxia,  )   Severe lab/radiological/exam abnormalities including:    Fall, acute on chronic diastolic CHF  and extensive comorbidities including:   CHF   That are currently affecting medical management.   I expect  patient to be hospitalized for 2 midnights requiring inpatient medical care.  Patient is at high risk for adverse outcome (such as loss of life or disability) if not treated.  Indication for inpatient stay as follows:   New or worsening hypoxia    Need for IV diuretics    Level of care     tele  For  24H      Reign Bartnick 07/26/2023, 11:51 PM    [facility]     after 2 AM please page floor coverage   If 7AM-7PM, please contact the day team taking care of the patient using [email or web address]

## 2023-07-26 NOTE — ED Notes (Signed)
 Patient O2 sats 87-89%, denies using O2 at home, MD in room. Patient placed on 2L via [place]. O2 96%

## 2023-07-26 NOTE — Assessment & Plan Note (Signed)
 Stable Not on anticoagulation given history of GI bleed and falls. Continue Toprol continue amiodarone 

## 2023-07-26 NOTE — ED Provider Notes (Cosign Needed Addendum)
 [place] EMERGENCY DEPARTMENT AT [facility] Provider Note   CSN: [id number] Arrival date & time: 07/26/23  1730     History  Chief Complaint  Patient presents with   Kristi Morales    ALISHAH LOGEMANN is a 89 y.o. female history of A-fib not currently anticoagulated but on amiodarone  presented for mechanical fall.  Patient states she was carrying groceries and tripped over her feet and landed hit the right side of her head.  Patient is laceration to the right eyebrow but denies LOC blood thinners head pain or neck pain.  Patient states she is able to ambulate and denies any new onset weakness or paresthesias or vision changes.  Patient is unsure of last tetanus.  Home Medications Prior to Admission medications   Medication Sig Start Date End Date Taking? Authorizing Provider  amiodarone  (PACERONE ) 200 MG tablet Take 0.5 tablets (100 mg total) by mouth daily. Patient taking differently: Take 100 mg by mouth every other day. 07/02/18   Verona Goodwill, MD  hydrochlorothiazide (HYDRODIURIL) 12.5 MG tablet Take 1 tablet by mouth daily. 04/23/18   [provider]  metoprolol succinate (TOPROL-XL) 25 MG 24 hr tablet Take 1 tablet by mouth daily.    [provider]  polyethylene glycol (MIRALAX  / GLYCOLAX ) packet Take 17 g by mouth 2 (two) times daily. Please adjust dose 08/11/15   Rai, Hurman Maiden, MD  SYNTHROID  125 MCG tablet Take 1 tablet by mouth daily. 06/12/18   [provider]      Allergies    Morphine and codeine    Review of Systems   Review of Systems  Physical Exam Updated Vital Signs BP (!) 204/86   Temp 97.6 F (36.4 C)   Resp 18   Ht 5' (1.524 m)   Wt 58.1 kg   SpO2 96%   BMI 25.02 kg/m  Physical Exam Constitutional:      General: She is not in acute distress. HENT:     Head:     Comments: 2 cm laceration right eyebrow not actively hemorrhaging Eyes:     Extraocular Movements: Extraocular movements intact.     Conjunctiva/sclera:  Conjunctivae normal.     Pupils: Pupils are equal, round, and reactive to light.  Cardiovascular:     Rate and Rhythm: Normal rate and regular rhythm.     Pulses: Normal pulses.     Heart sounds: Normal heart sounds.  Pulmonary:     Effort: Pulmonary effort is normal.     Breath sounds: Normal breath sounds.  Abdominal:     Palpations: Abdomen is soft.     Tenderness: There is no abdominal tenderness. There is no guarding or rebound.  Musculoskeletal:     Cervical back: Normal range of motion. No tenderness.     Comments: Mild tenderness to right elbow however no bony step-offs or crepitus or abnormalities 5 out of 5 bilateral grip strength, elbow flexion is extension Soft compartments Pain not out of proportion  Skin:    General: Skin is warm and dry.     Capillary Refill: Capillary refill takes less than 2 seconds.  Neurological:     Mental Status: She is alert and oriented to person, place, and time.     Comments: Sensation intact in all 4 extremities Cranial nerves III through XII intact Vision grossly intact  Psychiatric:        Mood and Affect: Mood normal.     ED Results / Procedures / Treatments  Labs (all labs ordered are listed, but only abnormal results are displayed) Labs Reviewed - No data to display  EKG None  Radiology DG Elbow Complete Right Result Date: 07/26/2023 CLINICAL DATA:  Fall EXAM: RIGHT ELBOW - COMPLETE 3+ VIEW COMPARISON:  None Available. FINDINGS: Subtle linear lucency is seen within the epicondyles suggesting a nondisplaced transcondylar fracture. Right elbow effusion is present. Normal overall alignment. No dislocation. Joint spaces are preserved. IMPRESSION: 1. Suspected nondisplaced transcondylar fracture. This could be confirmed with CT or MRI imaging. 2. Right elbow effusion. Electronically Signed   By: Worthy Heads M.D.   On: 07/26/2023 19:51    Procedures .Laceration Repair  Date/Time: 07/26/2023 6:56 PM  Performed by: Denese Finn, PA-C Authorized by: Denese Finn, PA-C   Consent:    Consent obtained:  Verbal   Consent given by:  Patient   Risks, benefits, and alternatives were discussed: yes     Risks discussed:  Need for additional repair, infection, nerve damage, pain, poor cosmetic result, poor wound healing, retained foreign body, tendon damage and vascular damage Universal protocol:    Procedure explained and questions answered to patient or proxy's satisfaction: yes     Imaging studies available: yes     Immediately prior to procedure, a time out was called: yes     Patient identity confirmed:  Verbally with patient Anesthesia:    Anesthesia method:  Nerve block   Block location:  Supraorbital   Block needle gauge:  25 G   Block anesthetic:  Lidocaine 2% WITH epi   Block technique:  Supraorbital   Block injection procedure:  Anatomic landmarks identified, anatomic landmarks palpated, negative aspiration for blood, introduced needle and incremental injection   Block outcome:  Anesthesia achieved Laceration details:    Location: Right eyebrow.   Length (cm):  2   Depth (mm):  3 Treatment:    Area cleansed with:  Saline   Amount of cleaning:  Standard   Irrigation solution:  Sterile saline   Irrigation volume:  250 mL   Irrigation method:  Pressure wash   Visualized foreign bodies/material removed: no     Debridement:  None Skin repair:    Repair method:  Sutures   Suture size:  5-0   Suture material:  Prolene   Suture technique:  Simple interrupted   Number of sutures:  9 Approximation:    Approximation:  Close Repair type:    Repair type:  Simple Post-procedure details:    Dressing:  Antibiotic ointment, bulky dressing and non-adherent dressing   Procedure completion:  Tolerated     Medications Ordered in ED Medications  bacitracin ointment (has no administration in time range)  lidocaine-EPINEPHrine (XYLOCAINE W/EPI) 2 %-1:200000 (PF) injection 10 mL (10 mLs Intradermal  Given 07/26/23 1800)  Tdap (BOOSTRIX) injection 0.5 mL (0.5 mLs Intramuscular Given 07/26/23 1930)    ED Course/ Medical Decision Making/ A&P                                 Medical Decision Making Amount and/or Complexity of Data Reviewed Labs: ordered. Radiology: ordered.  Risk OTC drugs. Prescription drug management.   DEBBRAH GRADILLAS 89 y.o. presented today for fall. Working DDx that I considered at this time includes, but not limited to, vasovagal episode, mechanical fall, ICH, epidural/subdural hematoma, basilar skull fracture, anemia, electrolyte abnormalities, drug-induced, arrhythmia, UTI, fracture, contusion, soft tissue injury.  R/o DDx:  Pending  Review of prior external notes: 05/15/2023 office visit  Unique Tests and My Independent Interpretation:  CT Head without contrast: Pending CT Cervical spine without contrast: Pending CT maxillofacial: Pending CT right elbow without contrast: Pending Right elbow x-ray: Suspected transcondylar fracture Chest x-ray: Pending CBC: Pending Troponin: Pending BMP: Pending  Social Determinants of Health: none  Discussion with Independent Historian:  EMS  Discussion of Management of Tests: None  Risk: Medium: prescription drug management  Risk Stratification Score: None  Staffed with Drury Geralds, MD  Plan: On exam patient was no acute distress was noted to be hypertensive at 204 systolic.  Patient states that she normally runs in the 160s and has been compliant with her medications.  Patient denies any chest pain shortness of breath or vision changes that be indicative of endorgan damage. Physical exam showed 2 cm laceration to right eyebrow does not actively hemorrhaging but otherwise is neurologically intact.  Also patient has tenderness to the right elbow but no signs of trauma and is also neurovascularly intact.  Will update tetanus and obtain imaging at the head neck and face along with right elbow.  Will repair laceration  standard with standard procedure.  Laceration was repaired without difficulty.  9 sutures were placed and patient was educated on wound care and return precautions and when she needs to return to these sutures removed which she verbalized understanding of.  Imaging shows suspected transcondylar fracture in the right elbow and so patient was placed in a long-arm splint.  I discussed with the patient signs and symptoms of compartment syndrome and reasons to return to the ER to which she verbalized understanding of.  Will get CT imaging of the elbow however once the CT image of the head and neck are back we will discharge and have her follow-up with an orthopedist.  Attending went to go evaluate the patient patient was 87% on room air.  Attending also notes some crackles and so we will get basic labs on the chest x-ray.  Patient signed out to Marcy Sexton, MD.  Please review their note for the continuation of patient's care.  The plan at this point is follow-up on labs and imaging.  This chart was dictated using voice recognition software.  Despite best efforts to proofread,  errors can occur which can change the documentation meaning.  Final Clinical Impression(s) / ED Diagnoses Final diagnoses:  Fall, initial encounter  Laceration of right eyebrow, initial encounter  Closed fracture of right elbow, initial encounter    Rx / DC Orders ED Discharge Orders     None         Elex Grimmer 07/26/23 2026    Denese Finn, PA-C 07/26/23 2034    Merdis Stalling, MD 07/30/23 1044

## 2023-07-26 NOTE — ED Notes (Signed)
 The pt denies blood thinners

## 2023-07-26 NOTE — Assessment & Plan Note (Signed)
 Will need close follow-up with orthopedics as an outpatient after patient is discharged

## 2023-07-26 NOTE — ED Triage Notes (Signed)
 The pt arrived by [place] after she fell in the grocery store no loc she struck her  head 2-3 " laceration through the rt eyebrow no active bleeding  she also has a painful rt elbow  no laceration there  all 3 rings removed from h er fingers  placed in a urine cup and placed in her purse  a and o x4

## 2023-07-26 NOTE — Subjective & Objective (Signed)
 Patient had a fall in grocery store no loss of consciousness did hit her head with 2-3 inches laceration from the right eyebrow and bleeding History of A-fib but not on AC This was a mechanical fall carrying groceries tripped over her feet landed hit headfirst.  Denies any neck pain.  Able to ambulate no new weakness paresthesias or visual changes.  Neurovascularly intact tetanus up dated however laceration was repaired She was also reporting right elbow pain imaging shows transcondylar fracture of the right elbow placed on long-arm splint Patient was found to be 87% on room air and have some crackles on exam.  Chest x-ray was ordered

## 2023-07-27 LAB — TROPONIN I (HIGH SENSITIVITY)
Troponin I (High Sensitivity): 14 ng/L (ref <18)
Troponin I (High Sensitivity): 14 ng/L (ref <18)

## 2023-07-27 LAB — COMPREHENSIVE METABOLIC PANEL WITH GFR
ALT: 8 U/L (ref 0–44)
AST: 20 U/L (ref 15–41)
Albumin: 3.7 g/dL (ref 3.5–5.0)
Alkaline Phosphatase: 55 U/L (ref 38–126)
Anion gap: 10 (ref 5–15)
BUN: 17 mg/dL (ref 8–23)
CO2: 29 mmol/L (ref 22–32)
Calcium: 8.7 mg/dL — ABNORMAL LOW (ref 8.9–10.3)
Chloride: 99 mmol/L (ref 98–111)
Creatinine, Ser: 1.02 mg/dL — ABNORMAL HIGH (ref 0.44–1.00)
GFR, Estimated: 51 mL/min — ABNORMAL LOW (ref >60)
Glucose, Bld: 124 mg/dL — ABNORMAL HIGH (ref 70–99)
Potassium: 3.6 mmol/L (ref 3.5–5.1)
Sodium: 138 mmol/L (ref 135–145)
Total Bilirubin: 0.9 mg/dL (ref 0.0–1.2)
Total Protein: 6.7 g/dL (ref 6.5–8.1)

## 2023-07-27 LAB — MAGNESIUM: Magnesium: 1.9 mg/dL (ref 1.7–2.4)

## 2023-07-27 LAB — CBC
HCT: 38.7 % (ref 36.0–46.0)
Hemoglobin: 12.3 g/dL (ref 12.0–15.0)
MCH: 32.4 pg (ref 26.0–34.0)
MCHC: 31.8 g/dL (ref 30.0–36.0)
MCV: 101.8 fL — ABNORMAL HIGH (ref 80.0–100.0)
Platelets: 193 10*3/uL (ref 150–400)
RBC: 3.8 MIL/uL — ABNORMAL LOW (ref 3.87–5.11)
RDW: 13.7 % (ref 11.5–15.5)
WBC: 7.1 10*3/uL (ref 4.0–10.5)
nRBC: 0 % (ref 0.0–0.2)

## 2023-07-27 LAB — PHOSPHORUS: Phosphorus: 4 mg/dL (ref 2.5–4.6)

## 2023-07-27 LAB — PREALBUMIN: Prealbumin: 27 mg/dL (ref 18–38)

## 2023-07-27 LAB — FOLATE: Folate: 17.9 ng/mL (ref >5.9)

## 2023-07-27 LAB — VITAMIN B12: Vitamin B-12: 750 pg/mL (ref 180–914)

## 2023-07-27 LAB — CK: Total CK: 150 U/L (ref 38–234)

## 2023-07-27 LAB — PROCALCITONIN: Procalcitonin: <0.1 ng/mL

## 2023-07-27 LAB — TSH: TSH: 1.07 u[IU]/mL (ref 0.350–4.500)

## 2023-07-27 MED ORDER — ALBUTEROL SULFATE (2.5 MG/3ML) 0.083% IN NEBU: 2.5000 mg | INHALATION_SOLUTION | RESPIRATORY_TRACT | Status: DC | PRN

## 2023-07-27 MED ORDER — HYDROCODONE-ACETAMINOPHEN 5-325 MG PO TABS
1.0000 | ORAL_TABLET | ORAL | Status: DC | PRN
Start: 2023-07-27 — End: 2023-07-27

## 2023-07-27 MED ORDER — ACETAMINOPHEN 325 MG PO TABS: 650.0000 mg | ORAL_TABLET | Freq: Four times a day (QID) | ORAL | Status: DC | PRN

## 2023-07-27 MED ORDER — ACETAMINOPHEN 650 MG RE SUPP: 650.0000 mg | Freq: Four times a day (QID) | RECTAL | Status: DC | PRN

## 2023-07-27 MED ORDER — ONDANSETRON HCL 4 MG/2ML IJ SOLN
4.0000 mg | Freq: Four times a day (QID) | INTRAMUSCULAR | Status: DC | PRN
Start: 2023-07-27 — End: 2023-07-27

## 2023-07-27 MED ORDER — METOPROLOL SUCCINATE ER 25 MG PO TB24
25.0000 mg | ORAL_TABLET | Freq: Every day | ORAL | Status: DC
  Administered 2023-07-27: 25 mg via ORAL
  Filled 2023-07-27: qty 1

## 2023-07-27 MED ORDER — SODIUM CHLORIDE 0.9% FLUSH
3.0000 mL | Freq: Two times a day (BID) | INTRAVENOUS | Status: DC
  Administered 2023-07-27: 3 mL via INTRAVENOUS

## 2023-07-27 MED ORDER — POLYETHYLENE GLYCOL 3350 17 G PO PACK
17.0000 g | PACK | Freq: Two times a day (BID) | ORAL | Status: DC
  Filled 2023-07-27: qty 1

## 2023-07-27 MED ORDER — AMIODARONE HCL 100 MG PO TABS
100.0000 mg | ORAL_TABLET | ORAL | Status: DC
Start: 2023-07-28 — End: 2023-07-27

## 2023-07-27 MED ORDER — FUROSEMIDE 10 MG/ML IJ SOLN
20.0000 mg | Freq: Once | INTRAMUSCULAR | Status: AC
  Administered 2023-07-27: 20 mg via INTRAVENOUS
  Filled 2023-07-27: qty 2

## 2023-07-27 MED ORDER — ONDANSETRON HCL 4 MG PO TABS: 4.0000 mg | ORAL_TABLET | Freq: Four times a day (QID) | ORAL | Status: DC | PRN

## 2023-07-27 MED ORDER — SODIUM CHLORIDE 0.9 % IV SOLN: 250.0000 mL | INTRAVENOUS | Status: DC | PRN

## 2023-07-27 MED ORDER — LEVOTHYROXINE SODIUM 112 MCG PO TABS
112.0000 ug | ORAL_TABLET | Freq: Every day | ORAL | Status: DC
  Administered 2023-07-27: 112 ug via ORAL
  Filled 2023-07-27: qty 1

## 2023-07-27 MED ORDER — SODIUM CHLORIDE 0.9% FLUSH: 3.0000 mL | INTRAVENOUS | Status: DC | PRN

## 2023-07-27 MED ORDER — ORAL CARE MOUTH RINSE: 15.0000 mL | OROMUCOSAL | Status: DC | PRN

## 2023-07-27 NOTE — Progress Notes (Signed)
 Patient wanting to go home without medical workup being completed. Advised on safety regarding leaving AMA.  MD aware, patient leaving AMA. IV removed with no complications.

## 2023-07-27 NOTE — Progress Notes (Signed)
 OT Cancellation Note  Patient Details Name: AGDA CRESSWELL MRN: [id number] DOB: Mar 18, 1928   Cancelled Treatment:    Reason Eval/Treat Not Completed: Other (comment) (Per conversation with RN, pt just signed AMA forms. OT to s/o. Please reconsult as appropriate)  Pascal Stiggers "Darral Ellis., OTR/L, MA Acute Rehab [phone]  Walt Gunner 07/27/2023, 11:02 AM

## 2023-07-27 NOTE — Plan of Care (Signed)
  Problem: Education: Goal: Knowledge of General Education information will improve Description: Including pain rating scale, medication(s)/side effects and non-pharmacologic comfort measures Outcome: Progressing   Problem: Nutrition: Goal: Adequate nutrition will be maintained Outcome: Progressing   Problem: Elimination: Goal: Will not experience complications related to bowel motility Outcome: Progressing Goal: Will not experience complications related to urinary retention Outcome: Progressing   

## 2023-07-27 NOTE — Progress Notes (Signed)
 PT Cancellation Note  Patient Details Name: RITAMAE HYDER MRN: [id number] DOB: April 10, 1927   Cancelled Treatment:    Reason Eval/Treat Not Completed: Other (comment) (PT consult appreciated and chart reviewed. Per RN, pt just signed AMA forms. PT to sign off, please reconsult as appropriate.)  Glenford Lanes, PT, DPT Acute Rehabilitation Services Office: [phone] Secure Chat Preferred  Riva Chester 07/27/2023, 11:05 AM

## 2023-07-27 NOTE — Progress Notes (Signed)
 Orthopedic Tech Progress Note Patient Details:  KYANNE ESKIN 1928-03-22 [id number]  Ortho Devices Type of Ortho Device: Arm sling, Post (long arm) splint Ortho Device/Splint Location: rue Ortho Device/Splint Interventions: Ordered, Application, Adjustment  I requested order for arm sling. Post Interventions Patient Tolerated: Well Instructions Provided: Care of device, Adjustment of device  Terryann Fiddler 07/27/2023, 12:02 AM

## 2024-02-11 ENCOUNTER — Ambulatory Visit: Admitting: Cardiology

## 2024-03-03 ENCOUNTER — Encounter: Payer: Self-pay | Admitting: Physician Assistant

## 2024-03-17 ENCOUNTER — Other Ambulatory Visit

## 2024-03-17 ENCOUNTER — Ambulatory Visit: Admitting: Physician Assistant

## 2024-03-17 ENCOUNTER — Encounter: Payer: Self-pay | Admitting: Physician Assistant

## 2024-03-17 VITALS — BP 160/92 | HR 64 | Ht 60.0 in | Wt 105.4 lb

## 2024-03-17 DIAGNOSIS — R634 Abnormal weight loss: Secondary | ICD-10-CM

## 2024-03-17 DIAGNOSIS — K5730 Diverticulosis of large intestine without perforation or abscess without bleeding: Secondary | ICD-10-CM | POA: Diagnosis not present

## 2024-03-17 DIAGNOSIS — K625 Hemorrhage of anus and rectum: Secondary | ICD-10-CM | POA: Diagnosis not present

## 2024-03-17 DIAGNOSIS — R1032 Left lower quadrant pain: Secondary | ICD-10-CM | POA: Diagnosis not present

## 2024-03-17 DIAGNOSIS — K5900 Constipation, unspecified: Secondary | ICD-10-CM

## 2024-03-17 DIAGNOSIS — K921 Melena: Secondary | ICD-10-CM | POA: Diagnosis not present

## 2024-03-17 DIAGNOSIS — K5909 Other constipation: Secondary | ICD-10-CM

## 2024-03-17 LAB — COMPREHENSIVE METABOLIC PANEL WITH GFR
ALT: 6 U/L (ref 3–35)
AST: 18 U/L (ref 5–37)
Albumin: 4.7 g/dL (ref 3.5–5.2)
Alkaline Phosphatase: 67 U/L (ref 39–117)
BUN: 15 mg/dL (ref 6–23)
CO2: 30 meq/L (ref 19–32)
Calcium: 9.2 mg/dL (ref 8.4–10.5)
Chloride: 102 meq/L (ref 96–112)
Creatinine, Ser: 1.07 mg/dL (ref 0.40–1.20)
GFR: 44.56 mL/min — ABNORMAL LOW (ref >60.00)
Glucose, Bld: 105 mg/dL — ABNORMAL HIGH (ref 70–99)
Potassium: 4.5 meq/L (ref 3.5–5.1)
Sodium: 140 meq/L (ref 135–145)
Total Bilirubin: 0.6 mg/dL (ref 0.2–1.2)
Total Protein: 7.2 g/dL (ref 6.0–8.3)

## 2024-03-17 LAB — CBC WITH DIFFERENTIAL/PLATELET
Basophils Absolute: 0 K/uL (ref 0.0–0.1)
Basophils Relative: 0.8 % (ref 0.0–3.0)
Eosinophils Absolute: 0.1 K/uL (ref 0.0–0.7)
Eosinophils Relative: 1.7 % (ref 0.0–5.0)
HCT: 37.3 % (ref 36.0–46.0)
Hemoglobin: 12.4 g/dL (ref 12.0–15.0)
Lymphocytes Relative: 18 % (ref 12.0–46.0)
Lymphs Abs: 1 K/uL (ref 0.7–4.0)
MCHC: 33.3 g/dL (ref 30.0–36.0)
MCV: 98.9 fl (ref 78.0–100.0)
Monocytes Absolute: 0.5 K/uL (ref 0.1–1.0)
Monocytes Relative: 8.3 % (ref 3.0–12.0)
Neutro Abs: 3.9 K/uL (ref 1.4–7.7)
Neutrophils Relative %: 71.2 % (ref 43.0–77.0)
Platelets: 242 K/uL (ref 150.0–400.0)
RBC: 3.78 Mil/uL — ABNORMAL LOW (ref 3.87–5.11)
RDW: 15.3 % (ref 11.5–15.5)
WBC: 5.5 K/uL (ref 4.0–10.5)

## 2024-03-17 NOTE — Progress Notes (Signed)
 Ellouise Console, PA-C [address] [place], [place]  [address] Phone: [phone]   Gastroenterology Consultation  Referring Provider:     Nichole Senior, MD Primary Care Physician:  Nichole Senior, MD Primary Gastroenterologist:  Ellouise Console, PA-C / Dr. Gordy Starch  Reason for Consultation:     Rectal bleeding        HPI:   Discussed the use of AI scribe software for clinical note transcription with the patient, who gave verbal consent to proceed.  89 year old female presents to evaluate rectal bleeding.  She last saw Vina Dasen in our office to a rectal bleeding in 2019.  She has history of internal hemorrhoids.  Also had a diverticular bleed in 2017.  Was started on MiraLAX  to avoid constipation.  She is not on any anticoagulants.  07/2023 last labs: Normal hemoglobin 12.3, platelet 193.  Normal B12 and folate.  BUN 17, creatinine 1.02, GFR 51.  Normal LFTs. History of Present Illness Rectal bleeding - Rectal bleeding occurred 1 month ago as a small spot of blood in stool. - Patient has not had any more episodes of rectal bleeding since 1 month ago. - History of diverticular bleed in 2017 which required hospitalization and blood transfusions. - She is anxious about having a recurrent diverticular bleed in the future.  Bowel habit changes - Daily use of Miralax  1 capful every day for the past seven years to manage bowel movements. - Recent onset of slightly loose stools, questioning possible contribution from Miralax . - She has had some mild intermittent left lower quadrant discomfort.  Weight loss - She has had recent weight loss in the past year. - She attributes weight loss due to dental implant procedures which took about a year to complete.  Dietary factors affecting stool appearance - Previous episode of red stools after consuming a large quantity of beets, initially causing concern until dietary cause was identified.  PMH: A-fib, CHF, HTN, history of lower  GI bleed (2017), hypothyroidism, acute blood loss anemia.  She does not take any blood thinner medications.    Past Medical History:  Diagnosis Date   A-fib [facility])    GI bleed 07/2015   HTN (hypertension)    Thyroid  disease     Past Surgical History:  Procedure Laterality Date   APPENDECTOMY     CESAREAN SECTION     COLONOSCOPY N/A 08/10/2015   Procedure: COLONOSCOPY;  Surgeon: Lynwood Bohr, MD;  Location: [facility] ENDOSCOPY;  Service: Endoscopy;  Laterality: N/A;   THYROIDECTOMY  1979   TONSILLECTOMY      Prior to Admission medications  Medication Sig Start Date End Date Taking? Authorizing Provider  amiodarone  (PACERONE ) 200 MG tablet Take 0.5 tablets (100 mg total) by mouth daily. Patient taking differently: Take 100 mg by mouth every other day. 07/02/18   Fernande Elspeth BROCKS, MD  levothyroxine  (SYNTHROID ) 112 MCG tablet Take 112 mcg by mouth daily. 06/12/18   [provider]  metoprolol  succinate (TOPROL -XL) 25 MG 24 hr tablet Take 1 tablet by mouth daily.    [provider]  polyethylene glycol (MIRALAX  / GLYCOLAX ) packet Take 17 g by mouth 2 (two) times daily. Please adjust dose Patient not taking: Reported on 07/27/2023 08/11/15   Davia Nydia POUR, MD    Family History  Problem Relation Age of Onset   Cancer Neg Hx    Diabetes Neg Hx    Heart failure Neg Hx    Hyperlipidemia Neg Hx  Social History[1]  Allergies as of 03/17/2024 - Review Complete 07/27/2023  Allergen Reaction Noted   Morphine and codeine  08/08/2015    Review of Systems:    All systems reviewed and negative except where noted in HPI.   Physical Exam:  BP (!) 160/92 (BP Location: Right Arm, Patient Position: Sitting, Cuff Size: Normal)   Pulse 64   Ht 5' (1.524 m)   Wt 105 lb 6 oz (47.8 kg)   BMI 20.58 kg/m  No LMP recorded. Patient is postmenopausal.  General:   Alert,  Well-developed, well-nourished, thin elderly female, pleasant and cooperative in NAD.  She is able to get on and  off exam table with no help.  Appears younger than stated age. Lungs:  Respirations even and unlabored.  Clear throughout to auscultation.   No wheezes, crackles, or rhonchi. No acute distress. Heart:  Regular rate and rhythm; no murmurs, clicks, rubs, or gallops. Abdomen:  Normal bowel sounds.  No bruits.  Soft, and non-distended without masses, hepatosplenomegaly or hernias noted.  Mild to moderate LLQ tenderness.  Rest of abdomen is not tender no guarding or rebound tenderness.    Neurologic:  Alert and oriented x3;  grossly normal neurologically. Psych:  Alert and cooperative. Normal mood and affect. Rectal: No external hemorrhoids.  Very tight anal sphincter, possible mild stricture.  No rectal tenderness or anal fissure.  Unable to do deep internal rectal exam.  Hemoccult negative.  Chaperone for Exam:  Alan Fusi, CMA   Imaging Studies: No results found.  Labs: CBC    Component Value Date/Time   WBC 7.1 07/27/2023 0258   RBC 3.80 (L) 07/27/2023 0258   HGB 12.3 07/27/2023 0258   HCT 38.7 07/27/2023 0258   PLT 193 07/27/2023 0258   MCV 101.8 (H) 07/27/2023 0258    CMP     Component Value Date/Time   NA 138 07/27/2023 0300   K 3.6 07/27/2023 0300   CL 99 07/27/2023 0300   CO2 29 07/27/2023 0300   GLUCOSE 124 (H) 07/27/2023 0300   BUN 17 07/27/2023 0300   CREATININE 1.02 (H) 07/27/2023 0300   CALCIUM  8.7 (L) 07/27/2023 0300   PROT 6.7 07/27/2023 0300   ALBUMIN 3.7 07/27/2023 0300   AST 20 07/27/2023 0300   ALT 8 07/27/2023 0300   ALKPHOS 55 07/27/2023 0300   BILITOT 0.9 07/27/2023 0300   GFRNONAA 51 (L) 07/27/2023 0300   GFRAA >60 08/11/2015 0416    Assessment and Plan:   LYNDEN CARRITHERS is a 89 y.o. y/o female has been referred for:  Assessment & Plan Rectal bleeding Mild intermittent rectal bleeding with a recent small spot of blood in stool.  Hemoccult negative exam today.  Differential includes internal hemorrhoids.  Symptoms are not consistent with  diverticular bleed.  Gave patient reassurance.  She has history of significant GI bleed in 2017, but no current massive bleeding. - Ordered blood work for hemoglobin levels. - Lab: CBC, CMP  Chronic constipation Managed with nightly Miralax  for seven years. Recent stool looseness possibly due to Miralax  dosage. - Reduce Miralax  to three-fourths of a capful nightly to prevent loose stools.  Left lower quadrant pain and tenderness - Ordered abdominal pelvic CT with contrast  Weight loss - Ordered abdominal pelvic CT with contrast    Follow up based on CT and lab results.  Ellouise Console, PA-C       [1]  Social History Tobacco Use   Smoking status: Never   Smokeless  tobacco: Never  Vaping Use   Vaping status: Never Used  Substance Use Topics   Alcohol  use: No   Drug use: No

## 2024-03-17 NOTE — Patient Instructions (Signed)
 Your provider has requested that you go to the basement level for lab work before leaving today. Press B on the elevator. The lab is located at the first door on the left as you exit the elevator.  You have been scheduled for a CT scan of the abdomen and pelvis at [facility], 1st floor Radiology. You are scheduled on 04/07/24 at 12:30pm. You should arrive at 10:15 am  Please follow the written instructions below on the day of your exam:   1) Do not eat anything after 8:30am (4 hours prior to your test)    You may take any medications as prescribed with a small amount of water, if necessary. If you take any of the following medications: METFORMIN, GLUCOPHAGE, GLUCOVANCE, AVANDAMET, RIOMET, FORTAMET, ACTOPLUS MET, JANUMET, GLUMETZA or METAGLIP, you MAY be asked to HOLD this medication 48 hours AFTER the exam.    If you have any questions regarding your exam or if you need to reschedule, you may call [facility] at [phone] between the hours of 8:00 am and 5:00 pm, Monday-Friday.   _______________________________________________________  If your blood pressure at your visit was 140/90 or greater, please contact your primary care physician to follow up on this.  _______________________________________________________  If you are age 80 or older, your body mass index should be between 23-30. Your Body mass index is 20.58 kg/m. If this is out of the aforementioned range listed, please consider follow up with your Primary Care Provider.  If you are age 54 or younger, your body mass index should be between 19-25. Your Body mass index is 20.58 kg/m. If this is out of the aformentioned range listed, please consider follow up with your Primary Care Provider.   ________________________________________________________  The [facility] providers would like to encourage you to use MYCHART to communicate with providers for non-urgent requests or questions.  Due to long hold times  on the telephone, sending your provider a message by [facility] may be a faster and more efficient way to get a response.  Please allow 48 business hours for a response.  Please remember that this is for non-urgent requests.  _______________________________________________________  [facility] is using a team-based approach to care.  Your team is made up of your doctor and two to three APPS. Our APPS (Nurse Practitioners and Physician Assistants) work with your physician to ensure care continuity for you. They are fully qualified to address your health concerns and develop a treatment plan. They communicate directly with your gastroenterologist to care for you. Seeing the Advanced Practice Practitioners on your physician's team can help you by facilitating care more promptly, often allowing for earlier appointments, access to diagnostic testing, procedures, and other specialty referrals.

## 2024-03-18 ENCOUNTER — Ambulatory Visit: Payer: Self-pay | Admitting: Physician Assistant

## 2024-03-18 DIAGNOSIS — K5730 Diverticulosis of large intestine without perforation or abscess without bleeding: Secondary | ICD-10-CM

## 2024-03-18 DIAGNOSIS — K5900 Constipation, unspecified: Secondary | ICD-10-CM

## 2024-03-18 DIAGNOSIS — R1032 Left lower quadrant pain: Secondary | ICD-10-CM

## 2024-03-18 DIAGNOSIS — R634 Abnormal weight loss: Secondary | ICD-10-CM

## 2024-03-18 NOTE — Progress Notes (Signed)
 Call and notify patient her labs show: 1.  Normal liver test and electrolytes. 2.  Chronic kidney disease (stage III).   3.  Normal CBC.  White count and hemoglobin are normal.  No evidence of infection or anemia.   Continue with plan for abdominal pelvic CT - scheduled 04/07/2024. Abdominal pelvic CT is scheduled with contrast.  I recommend change order to abdominal pelvic CT without contrast to preserve kidney function.  Please call imaging and change CT to without contrast. Advise patient to drink 64 ounces of water daily and avoid all NSAIDs to also help protect kidneys. Ellouise Console, PA-C

## 2024-04-07 ENCOUNTER — Ambulatory Visit (HOSPITAL_COMMUNITY)

## 2024-04-07 ENCOUNTER — Ambulatory Visit (HOSPITAL_COMMUNITY)
Admission: RE | Admit: 2024-04-07 | Discharge: 2024-04-07 | Disposition: A | Discharge status: Home / Self Care | Source: Ambulatory Visit | Attending: Physician Assistant | Admitting: Physician Assistant

## 2024-04-07 DIAGNOSIS — R1032 Left lower quadrant pain: Secondary | ICD-10-CM | POA: Diagnosis present

## 2024-04-07 DIAGNOSIS — K5730 Diverticulosis of large intestine without perforation or abscess without bleeding: Secondary | ICD-10-CM | POA: Insufficient documentation

## 2024-04-07 DIAGNOSIS — K5900 Constipation, unspecified: Secondary | ICD-10-CM | POA: Diagnosis present

## 2024-04-07 DIAGNOSIS — R634 Abnormal weight loss: Secondary | ICD-10-CM | POA: Insufficient documentation

## 2024-04-08 ENCOUNTER — Ambulatory Visit: Payer: Self-pay | Admitting: Physician Assistant

## 2024-04-08 NOTE — Progress Notes (Signed)
 Please call and notify patient abdominal pelvic CT shows: 1.  No acute abnormality in the abdomen or pelvis.  No evidence of bowel inflammation, infection, diverticulitis, or appendicitis.  She has diverticulosis, but no diverticulitis.  No colon masses. 2.  No evidence of kidney stones.  There is a small benign right kidney cyst, not worrisome. 3.  Small right pleural effusion (lung effusion).  Please follow-up with PCP for this. 4.  Normal liver, pancreas, spleen.  Normal uterus and ovaries. 5.  Small gallstones versus sludge in the gallbladder.  No evidence of gallbladder infection. 6.  No enlarged lymph nodes, masses, or evidence of cancer in the abdomen or pelvis. Continue with current plan.  Follow-up if she has any recurrent rectal bleeding or other GI symptoms. Ellouise Console, PA-C

## 2024-04-15 ENCOUNTER — Emergency Department (HOSPITAL_COMMUNITY)

## 2024-04-15 ENCOUNTER — Inpatient Hospital Stay (HOSPITAL_COMMUNITY)
Admission: EM | Admit: 2024-04-15 | Discharge: 2024-05-02 | DRG: 951 | Disposition: E | Attending: Family Medicine | Admitting: Family Medicine

## 2024-04-15 DIAGNOSIS — G919 Hydrocephalus, unspecified: Secondary | ICD-10-CM | POA: Diagnosis present

## 2024-04-15 DIAGNOSIS — J90 Pleural effusion, not elsewhere classified: Secondary | ICD-10-CM | POA: Diagnosis present

## 2024-04-15 DIAGNOSIS — I10 Essential (primary) hypertension: Secondary | ICD-10-CM | POA: Diagnosis present

## 2024-04-15 DIAGNOSIS — Z79899 Other long term (current) drug therapy: Secondary | ICD-10-CM

## 2024-04-15 DIAGNOSIS — Z9049 Acquired absence of other specified parts of digestive tract: Secondary | ICD-10-CM

## 2024-04-15 DIAGNOSIS — I480 Paroxysmal atrial fibrillation: Secondary | ICD-10-CM | POA: Diagnosis present

## 2024-04-15 DIAGNOSIS — I63511 Cerebral infarction due to unspecified occlusion or stenosis of right middle cerebral artery: Principal | ICD-10-CM | POA: Diagnosis present

## 2024-04-15 DIAGNOSIS — Z66 Do not resuscitate: Secondary | ICD-10-CM | POA: Diagnosis present

## 2024-04-15 DIAGNOSIS — E890 Postprocedural hypothyroidism: Secondary | ICD-10-CM | POA: Diagnosis present

## 2024-04-15 DIAGNOSIS — Z8719 Personal history of other diseases of the digestive system: Secondary | ICD-10-CM | POA: Diagnosis not present

## 2024-04-15 DIAGNOSIS — Z79890 Hormone replacement therapy (postmenopausal): Secondary | ICD-10-CM | POA: Diagnosis not present

## 2024-04-15 DIAGNOSIS — Z515 Encounter for palliative care: Principal | ICD-10-CM

## 2024-04-15 DIAGNOSIS — Z885 Allergy status to narcotic agent status: Secondary | ICD-10-CM

## 2024-04-15 DIAGNOSIS — R4182 Altered mental status, unspecified: Secondary | ICD-10-CM | POA: Diagnosis present

## 2024-04-15 DIAGNOSIS — E039 Hypothyroidism, unspecified: Secondary | ICD-10-CM | POA: Diagnosis present

## 2024-04-15 LAB — CBC WITH DIFFERENTIAL/PLATELET
Abs Immature Granulocytes: 0.03 K/uL (ref 0.00–0.07)
Basophils Absolute: 0 K/uL (ref 0.0–0.1)
Basophils Relative: 0 %
Eosinophils Absolute: 0 K/uL (ref 0.0–0.5)
Eosinophils Relative: 0 %
HCT: 42.1 % (ref 36.0–46.0)
Hemoglobin: 13.4 g/dL (ref 12.0–15.0)
Immature Granulocytes: 0 %
Lymphocytes Relative: 9 %
Lymphs Abs: 0.9 K/uL (ref 0.7–4.0)
MCH: 33.3 pg (ref 26.0–34.0)
MCHC: 31.8 g/dL (ref 30.0–36.0)
MCV: 104.5 fL — ABNORMAL HIGH (ref 80.0–100.0)
Monocytes Absolute: 0.5 K/uL (ref 0.1–1.0)
Monocytes Relative: 6 %
Neutro Abs: 8.1 K/uL — ABNORMAL HIGH (ref 1.7–7.7)
Neutrophils Relative %: 85 %
Platelets: 203 K/uL (ref 150–400)
RBC: 4.03 MIL/uL (ref 3.87–5.11)
RDW: 14.8 % (ref 11.5–15.5)
WBC: 9.6 K/uL (ref 4.0–10.5)
nRBC: 0 % (ref 0.0–0.2)

## 2024-04-15 LAB — ETHANOL: Alcohol, Ethyl (B): <15 mg/dL

## 2024-04-15 LAB — I-STAT VENOUS BLOOD GAS, ED
Acid-Base Excess: 1 mmol/L (ref 0.0–2.0)
Bicarbonate: 24.9 mmol/L (ref 20.0–28.0)
Calcium, Ion: 0.98 mmol/L — ABNORMAL LOW (ref 1.15–1.40)
HCT: 40 % (ref 36.0–46.0)
Hemoglobin: 13.6 g/dL (ref 12.0–15.0)
O2 Saturation: 96 %
Potassium: 5.7 mmol/L — ABNORMAL HIGH (ref 3.5–5.1)
Sodium: 139 mmol/L (ref 135–145)
TCO2: 26 mmol/L (ref 22–32)
pCO2, Ven: 37.3 mmHg — ABNORMAL LOW (ref 44–60)
pH, Ven: 7.434 — ABNORMAL HIGH (ref 7.25–7.43)
pO2, Ven: 79 mmHg — ABNORMAL HIGH (ref 32–45)

## 2024-04-15 LAB — COMPREHENSIVE METABOLIC PANEL WITH GFR
ALT: 15 U/L (ref 0–44)
AST: 36 U/L (ref 15–41)
Albumin: 4.3 g/dL (ref 3.5–5.0)
Alkaline Phosphatase: 68 U/L (ref 38–126)
Anion gap: 15 (ref 5–15)
BUN: 19 mg/dL (ref 8–23)
CO2: 23 mmol/L (ref 22–32)
Calcium: 8.5 mg/dL — ABNORMAL LOW (ref 8.9–10.3)
Chloride: 104 mmol/L (ref 98–111)
Creatinine, Ser: 1.01 mg/dL — ABNORMAL HIGH (ref 0.44–1.00)
GFR, Estimated: 51 mL/min — ABNORMAL LOW
Glucose, Bld: 117 mg/dL — ABNORMAL HIGH (ref 70–99)
Potassium: 4.4 mmol/L (ref 3.5–5.1)
Sodium: 141 mmol/L (ref 135–145)
Total Bilirubin: 0.8 mg/dL (ref 0.0–1.2)
Total Protein: 7.2 g/dL (ref 6.5–8.1)

## 2024-04-15 LAB — I-STAT CG4 LACTIC ACID, ED: Lactic Acid, Venous: 1.5 mmol/L (ref 0.5–1.9)

## 2024-04-15 LAB — PRO BRAIN NATRIURETIC PEPTIDE: Pro Brain Natriuretic Peptide: 2149 pg/mL — ABNORMAL HIGH

## 2024-04-15 LAB — AMMONIA: Ammonia: 19 umol/L (ref 9–35)

## 2024-04-15 LAB — CBG MONITORING, ED: Glucose-Capillary: 109 mg/dL — ABNORMAL HIGH (ref 70–99)

## 2024-04-15 MED ORDER — PROCHLORPERAZINE MALEATE 10 MG PO TABS: 10.0000 mg | ORAL_TABLET | Freq: Four times a day (QID) | ORAL | Status: DC | PRN

## 2024-04-15 MED ORDER — GLYCOPYRROLATE 0.2 MG/ML IJ SOLN
0.2000 mg | INTRAMUSCULAR | Status: DC | PRN
  Administered 2024-04-15 – 2024-04-16 (×3): 0.2 mg via INTRAVENOUS
  Filled 2024-04-15 (×3): qty 1

## 2024-04-15 MED ORDER — SODIUM CHLORIDE 0.9 % IV BOLUS: 250.0000 mL | Freq: Once | INTRAVENOUS | Status: DC

## 2024-04-15 MED ORDER — GLYCOPYRROLATE 0.2 MG/ML IJ SOLN: 0.2000 mg | INTRAMUSCULAR | Status: DC | PRN

## 2024-04-15 MED ORDER — PROCHLORPERAZINE EDISYLATE 10 MG/2ML IJ SOLN: 10.0000 mg | Freq: Four times a day (QID) | INTRAMUSCULAR | Status: DC | PRN

## 2024-04-15 MED ORDER — HALOPERIDOL LACTATE 5 MG/ML IJ SOLN: 2.5000 mg | INTRAMUSCULAR | Status: DC | PRN

## 2024-04-15 MED ORDER — GLYCOPYRROLATE 1 MG PO TABS
1.0000 mg | ORAL_TABLET | ORAL | Status: DC | PRN
  Filled 2024-04-15: qty 1

## 2024-04-15 MED ORDER — ACETAMINOPHEN 325 MG PO TABS: 650.0000 mg | ORAL_TABLET | Freq: Four times a day (QID) | ORAL | Status: DC | PRN

## 2024-04-15 MED ORDER — ALBUTEROL SULFATE (2.5 MG/3ML) 0.083% IN NEBU: 2.5000 mg | INHALATION_SOLUTION | RESPIRATORY_TRACT | Status: DC | PRN

## 2024-04-15 MED ORDER — SODIUM CHLORIDE 0.9 % IV SOLN: INTRAVENOUS | Status: DC

## 2024-04-15 MED ORDER — VANCOMYCIN HCL IN DEXTROSE 1-5 GM/200ML-% IV SOLN
1000.0000 mg | Freq: Once | INTRAVENOUS | Status: AC
  Administered 2024-04-15: 1000 mg via INTRAVENOUS
  Filled 2024-04-15: qty 200

## 2024-04-15 MED ORDER — HYDROMORPHONE BOLUS VIA INFUSION: 1.0000 mg | INTRAVENOUS | Status: DC | PRN

## 2024-04-15 MED ORDER — POLYVINYL ALCOHOL 1.4 % OP SOLN: 1.0000 [drp] | Freq: Four times a day (QID) | OPHTHALMIC | Status: DC | PRN

## 2024-04-15 MED ORDER — PIPERACILLIN-TAZOBACTAM 3.375 G IVPB 30 MIN
3.3750 g | Freq: Once | INTRAVENOUS | Status: AC
  Administered 2024-04-15: 3.375 g via INTRAVENOUS
  Filled 2024-04-15: qty 50

## 2024-04-15 MED ORDER — LORAZEPAM 2 MG/ML IJ SOLN: 2.0000 mg | INTRAMUSCULAR | Status: DC | PRN

## 2024-04-15 MED ORDER — HYDROMORPHONE HCL-NACL 50-0.9 MG/50ML-% IV SOLN
0.0000 mg/h | INTRAVENOUS | Status: DC
  Administered 2024-04-15 (×2): 0.5 mg/h via INTRAVENOUS
  Administered 2024-04-15: 1 mg/h via INTRAVENOUS
  Administered 2024-04-15: 0.5 mg/h via INTRAVENOUS
  Administered 2024-04-16 (×3): 3 mg/h via INTRAVENOUS
  Administered 2024-04-16: 0.5 mg/h via INTRAVENOUS
  Filled 2024-04-15 (×2): qty 50

## 2024-04-15 MED ORDER — PROCHLORPERAZINE 25 MG RE SUPP
25.0000 mg | Freq: Two times a day (BID) | RECTAL | Status: DC | PRN
  Filled 2024-04-15: qty 1

## 2024-04-15 MED ORDER — BIOTENE DRY MOUTH MT LIQD: 15.0000 mL | OROMUCOSAL | Status: DC | PRN

## 2024-04-15 MED ORDER — ACETAMINOPHEN 650 MG RE SUPP: 650.0000 mg | Freq: Four times a day (QID) | RECTAL | Status: DC | PRN

## 2024-04-15 NOTE — H&P (Signed)
 " History and Physical    Kristi Morales FMW:[id number] DOB: [birth year] DOA: 04/15/2024  PCP: Nichole Senior, MD  Patient coming from: Home  I have personally briefly reviewed patient's old medical records in [facility]  Chief Complaint: Unresponsive  HPI: Kristi Morales is a 89 y.o. female with medical history significant for PAF not on anticoagulation due to history of GI bleed, HTN, hypothyroidism who presented to the ED from home for evaluation of unresponsiveness.  Per report, patient lives alone.  Patient's family spoke with her last night at 9:30 PM and she appeared to be in her normal state of health.  They called her again this morning with no answer.  They came to the house around 1530 and patient appeared to be sleeping so they let her rest.  Around 1630 they tried to wake her up but she was unresponsive.  They called 911.  GCS was 5.  Family reported the patient is DNR/DNI.  She was placed on NRB and brought to the ED for further evaluation.  Patient remains unresponsive.  CT shows a large acute right MCA territory infarct with focal occlusion of the right MCA.  ED provider discussed with family and decision was to pursue comfort care measures only.  She has been started on IV Dilaudid  infusion to adjust for analgesia.  ED Course  Labs/Imaging on admission: I have personally reviewed following labs and imaging studies.  Initial vitals showed BP 179/146, pulse 73, RR 18, temp 99.3 F, SpO2 100% on nonrebreather.  Labs showed WBC 9.6, hemoglobin 13.4, platelets 203, sodium 141, potassium 4.4, bicarb 23, BUN 19, creatinine 1.01, serum glucose 117, proBNP 2149, lactic acid 1.5.  Serum ethanol <15, ammonia 19.  Portable chest x-ray showed enlarged cardiac silhouette, pulmonary vascular congestion, small right pleural effusion.  CT head without contrast showed an acute right MCA territory infarct with focal occlusion of the right MCA.  Mild dilatation of the left lateral  ventricle concerning for developing hydrocephalus noted.  Patient was initially given IV vancomycin  and Zosyn .  EDP discussed with patient's family and decision was to pursue comfort care measures only.  Patient was started on IV Dilaudid  drip.  The hospitalist service was consulted for admission.  Review of Systems:  Unable to obtain full review of systems due to unresponsiveness.   Past Medical History:  Diagnosis Date   A-fib [facility])    GI bleed 07/2015   HTN (hypertension)    Thyroid  disease     Past Surgical History:  Procedure Laterality Date   APPENDECTOMY     CESAREAN SECTION     COLONOSCOPY N/A 08/10/2015   Procedure: COLONOSCOPY;  Surgeon: Lynwood Bohr, MD;  Location: [facility] ENDOSCOPY;  Service: Endoscopy;  Laterality: N/A;   THYROIDECTOMY  1979   TONSILLECTOMY      Social History: Social History[1]  Allergies[2]  Family History  Problem Relation Age of Onset   Cancer Neg Hx    Diabetes Neg Hx    Heart failure Neg Hx    Hyperlipidemia Neg Hx      Prior to Admission medications  Medication Sig Start Date End Date Taking? Authorizing Provider  amiodarone  (PACERONE ) 200 MG tablet Take 0.5 tablets (100 mg total) by mouth daily. Patient taking differently: Take 100 mg by mouth every other day. 07/02/18   Fernande Elspeth BROCKS, MD  levothyroxine  (SYNTHROID ) 112 MCG tablet Take 112 mcg by mouth daily. 06/12/18   [provider]  metoprolol  succinate (TOPROL -XL) 25 MG 24  hr tablet Take 1 tablet by mouth daily.    [provider]  polyethylene glycol (MIRALAX  / GLYCOLAX ) packet Take 17 g by mouth 2 (two) times daily. Please adjust dose Patient not taking: Reported on 07/27/2023 08/11/15   Davia Nydia POUR, MD    Physical Exam: Vitals:   04/15/24 2130 04/15/24 2145 04/15/24 2200 04/15/24 2215  BP:      Pulse: (!) 59 (!) 59 65 68  Resp: 12 16 17 16   Temp:      TempSrc:      SpO2: 98% 95% 93% 90%   Exam limited due to unresponsive state Constitutional: Thin  elderly woman resting supine in bed.  Unarousable Eyes: Pupils nonreactive to light, lids and conjunctivae normal ENMT: Mucous membranes are dry.  Neck: normal, supple, no masses. Respiratory: Coarse lung sounds anteriorly. Normal respiratory effort. No accessory muscle use.  Cardiovascular: Regular rate and rhythm, no murmurs / rubs / gallops. No extremity edema.  Abdomen: no obvious tenderness elicited with palpation Musculoskeletal: No joint deformity upper and lower extremities.  Skin: no rashes, lesions, ulcers. No induration Neurologic: Pupils nonreactive to light.  Upgoing toes on Babinski.  Patient not following commands or moving extremities spontaneously Psychiatric: Unresponsive  EKG: Personally reviewed. Sinus rhythm, rate 73, motion artifact.  Previous EKG showed sinus bradycardia rate 57.  Assessment/Plan Principal Problem:   Acute right MCA stroke [facility]) Active Problems:   Paroxysmal atrial fibrillation (HCC)   Hypothyroidism   Comfort measures only status   Kristi Morales is a 89 y.o. female with medical history significant for PAF not on anticoagulation due to history of GI bleed, HTN, hypothyroidism who presented to the ED unresponsive and was found to have an acute large right MCA territory stroke with focal occlusion of the right MCA.  She is admitted for comfort care measures only.  Assessment and Plan: Comfort care/end-of-life measures only Acute large right MCA territory infarct with developing hydrocephalus: Patient unresponsive on arrival and remains unresponsive.  EDP has discussed with family who confirmed DNR/DNI status and decision was made to pursue comfort care measures only.  I also discussed with her son who confirms these wishes. - Continue IV Dilaudid  infusion and titrate for analgesia - Continue Haldol , Ativan , Compazine , Robinul  as needed - DNR/DNI, comfort care measures only  Paroxysmal atrial fibrillation Hypertension Hypothyroidism    DVT  prophylaxis: None Code Status: DNR/DNI, comfort care measures only Family Communication: Discussed with son by phone Disposition Plan: Anticipate in-hospital death Consults called: None Severity of Illness: The appropriate patient status for this patient is INPATIENT. Inpatient status is judged to be reasonable and necessary in order to provide the required intensity of service to ensure the patient's safety. The patient's presenting symptoms, physical exam findings, and initial radiographic and laboratory data in the context of their chronic comorbidities is felt to place them at high risk for further clinical deterioration. Furthermore, it is not anticipated that the patient will be medically stable for discharge from the hospital within 2 midnights of admission.   * I certify that at the point of admission it is my clinical judgment that the patient will require inpatient hospital care spanning beyond 2 midnights from the point of admission due to high intensity of service, high risk for further deterioration and high frequency of surveillance required.DEWAINE Jorie Blanch MD [facility]  If 7PM-7AM, please contact night-coverage [email or web address]  04/15/2024, 11:31 PM      [1]  Social History Tobacco Use  Smoking status: Never   Smokeless tobacco: Never  Vaping Use   Vaping status: Never Used  Substance Use Topics   Alcohol  use: No   Drug use: No  [2]  Allergies Allergen Reactions   Morphine And Codeine     NAUSEA & VOMITTING    "

## 2024-04-15 NOTE — ED Notes (Signed)
 1st lactic 1.5 in normal range, 2nd not needed

## 2024-04-15 NOTE — ED Provider Notes (Signed)
 " [facility] Provider Note   CSN: [id number] Arrival date & time: 04/15/24  1749     Patient presents with: Altered Mental Status   Kristi Morales is a 89 y.o. female.   47 y oF with a chief complaints of unresponsiveness.  Patient was found in her bed unresponsive.  Last seen normal last night about 930.  No obvious complaints at that time.  Had recently had a CT imaging Through the GI clinic.   Altered Mental Status      Prior to Admission medications  Medication Sig Start Date End Date Taking? Authorizing Provider  amiodarone  (PACERONE ) 200 MG tablet Take 0.5 tablets (100 mg total) by mouth daily. Patient taking differently: Take 100 mg by mouth every other day. 07/02/18   Fernande Elspeth BROCKS, MD  levothyroxine  (SYNTHROID ) 112 MCG tablet Take 112 mcg by mouth daily. 06/12/18   [provider]  metoprolol  succinate (TOPROL -XL) 25 MG 24 hr tablet Take 1 tablet by mouth daily.    [provider]  polyethylene glycol (MIRALAX  / GLYCOLAX ) packet Take 17 g by mouth 2 (two) times daily. Please adjust dose Patient not taking: Reported on 07/27/2023 08/11/15   Rai, Nydia POUR, MD    Allergies: Morphine and codeine    Review of Systems  Updated Vital Signs BP (!) 182/78   Pulse 68   Temp 99.3 F (37.4 C) (Axillary)   Resp 16   SpO2 90%   Physical Exam Vitals and nursing note reviewed.  Constitutional:      General: She is not in acute distress.    Appearance: She is well-developed. She is not diaphoretic.  HENT:     Head: Normocephalic and atraumatic.  Eyes:     Pupils: Pupils are equal, round, and reactive to light.  Cardiovascular:     Rate and Rhythm: Normal rate and regular rhythm.     Heart sounds: No murmur heard.    No friction rub. No gallop.  Pulmonary:     Effort: Pulmonary effort is normal.     Breath sounds: No wheezing or rales.     Comments: Coarse breath sounds in all fields.  JVD. Abdominal:      General: There is no distension.     Palpations: Abdomen is soft.     Tenderness: There is no abdominal tenderness.  Musculoskeletal:        General: No tenderness.     Cervical back: Normal range of motion and neck supple.  Skin:    General: Skin is warm and dry.  Neurological:     Comments: Moans and withdraws to pain  Psychiatric:        Behavior: Behavior normal.     (all labs ordered are listed, but only abnormal results are displayed) Labs Reviewed  CBC WITH DIFFERENTIAL/PLATELET - Abnormal; Notable for the following components:      Result Value   MCV 104.5 (*)    Neutro Abs 8.1 (*)    All other components within normal limits  PRO BRAIN NATRIURETIC PEPTIDE - Abnormal; Notable for the following components:   Pro Brain Natriuretic Peptide 2,149.0 (*)    All other components within normal limits  COMPREHENSIVE METABOLIC PANEL WITH GFR - Abnormal; Notable for the following components:   Glucose, Bld 117 (*)    Creatinine, Ser 1.01 (*)    Calcium  8.5 (*)    GFR, Estimated 51 (*)    All other components within normal  limits  CBG MONITORING, ED - Abnormal; Notable for the following components:   Glucose-Capillary 109 (*)    All other components within normal limits  I-STAT VENOUS BLOOD GAS, ED - Abnormal; Notable for the following components:   pH, Ven 7.434 (*)    pCO2, Ven 37.3 (*)    pO2, Ven 79 (*)    Potassium 5.7 (*)    Calcium , Ion 0.98 (*)    All other components within normal limits  CULTURE, BLOOD (ROUTINE X 2)  ETHANOL  AMMONIA  I-STAT CG4 LACTIC ACID, ED    EKG: EKG Interpretation Date/Time:  Thursday April 15 2024 18:11:05 EST Ventricular Rate:  73 PR Interval:  177 QRS Duration:  147 QT Interval:  440 QTC Calculation: 485 R Axis:   86  Text Interpretation: Sinus rhythm IVCD, consider atypical RBBB LVH with secondary repolarization abnormality Artifact in lead(s) I II III aVR aVL aVF V1 V2 No significant change since last tracing Confirmed  by Emil Share [phone]) on 04/15/2024 7:44:47 PM  Radiology: CT Head Wo Contrast Result Date: 04/15/2024 EXAM: CT HEAD WITHOUT CONTRAST 04/15/2024 07:42:46 PM TECHNIQUE: CT of the head was performed without the administration of intravenous contrast. Automated exposure control, iterative reconstruction, and/or weight based adjustment of the mA/kV was utilized to reduce the radiation dose to as low as reasonably achievable. COMPARISON: 07/26/2023 CLINICAL HISTORY: Mental status change, unknown cause. FINDINGS: BRAIN AND VENTRICLES: Endotracheal tube in place. Loss of gray-white differentiation throughout right MCA territory, with edema and sulcal effacement. Increased density is noted in the right middle cerebral artery consistent with focal occlusion. Mild effacement of right lateral ventricle. Dilatation of left lateral ventricle, likely developing hydrocephalus. Atherosclerotic calcifications in intracranial carotid and vertebral arteries. No acute hemorrhage. No extra-axial collection. No mass effect or midline shift. ORBITS: Status post bilateral lens replacements. No acute abnormality. SINUSES: Mild mucosal thickening throughout paranasal sinuses. SOFT TISSUES AND SKULL: No acute soft tissue abnormality. No skull fracture. IMPRESSION: 1. Acute right MCA territory infarct with focal occlusion of the right MCA. 2. Mild dilatation of the left lateral ventricle, concerning for developing hydrocephalus. Critical findings were discussed with Dr. Emil at  07:50 pm EST on 04/15/2024 Electronically signed by: Oneil Devonshire MD 04/15/2024 07:51 PM EST RP Workstation: [id number]   DG Chest Port 1 View Result Date: 04/15/2024 CLINICAL DATA:  Questionable sepsis, unresponsive EXAM: PORTABLE CHEST 1 VIEW COMPARISON:  07/26/2023 FINDINGS: Single frontal view of the chest demonstrates stable enlargement of the cardiac silhouette. There is pulmonary vascular congestion without acute airspace disease. Small right pleural  effusion. No pneumothorax. IMPRESSION: 1. Congestive heart failure, with increased pulmonary vascular congestion. 2. Small right pleural effusion. Electronically Signed   By: Ozell Daring M.D.   On: 04/15/2024 18:31     .Critical Care  Performed by: Emil Share, DO Authorized by: Emil Share, DO   Critical care provider statement:    Critical care time (minutes):  80   Critical care time was exclusive of:  Separately billable procedures and treating other patients   Critical care was time spent personally by me on the following activities:  Development of treatment plan with patient or surrogate, discussions with consultants, evaluation of patient's response to treatment, examination of patient, ordering and review of laboratory studies, ordering and review of radiographic studies, ordering and performing treatments and interventions, pulse oximetry, re-evaluation of patient's condition and review of old charts   Care discussed with: admitting provider      Medications Ordered in  the ED  acetaminophen  (TYLENOL ) tablet 650 mg (has no administration in time range)    Or  acetaminophen  (TYLENOL ) suppository 650 mg (has no administration in time range)  glycopyrrolate  (ROBINUL ) tablet 1 mg (has no administration in time range)    Or  glycopyrrolate  (ROBINUL ) injection 0.2 mg (has no administration in time range)    Or  glycopyrrolate  (ROBINUL ) injection 0.2 mg (has no administration in time range)  artificial tears ophthalmic solution 1 drop (has no administration in time range)  HYDROmorphone  (DILAUDID ) bolus via infusion 1 mg (has no administration in time range)  HYDROmorphone  (DILAUDID ) 50 mg in 50 mL NS (1mg /mL) premix infusion (2.5 mg/hr Intravenous Rate/Dose Change 04/15/24 2324)  LORazepam  (ATIVAN ) injection 2-4 mg (has no administration in time range)  haloperidol  lactate (HALDOL ) injection 2.5-5 mg (has no administration in time range)  albuterol  (PROVENTIL ) (2.5 MG/3ML) 0.083%  nebulizer solution 2.5 mg (has no administration in time range)  prochlorperazine  (COMPAZINE ) tablet 10 mg (has no administration in time range)    Or  prochlorperazine  (COMPAZINE ) suppository 25 mg (has no administration in time range)    Or  prochlorperazine  (COMPAZINE ) injection 10 mg (has no administration in time range)  antiseptic oral rinse (BIOTENE) solution 15 mL (has no administration in time range)  vancomycin  (VANCOCIN ) IVPB 1000 mg/200 mL premix (0 mg Intravenous Stopped 04/15/24 2002)  piperacillin -tazobactam (ZOSYN ) IVPB 3.375 g (0 g Intravenous Stopped 04/15/24 2002)                                    Medical Decision Making Amount and/or Complexity of Data Reviewed Labs: ordered. Radiology: ordered.  Risk OTC drugs. Prescription drug management. Decision regarding hospitalization.   89 yo F with a cc of unresponsiveness.  Last seen normal last night about 9:30 PM.  Went over to her house this morning and she did not answer the phone is normal.  Found to be unresponsive.  Discussed with the family.  They did confirm that she is a DNR DNI.  Did agree to a laboratory evaluation to try to assess for possible cause.  Perhaps aspiration versus primary pneumonia on my independent interpretation of the patient's chest x-ray..  Will start on antibiotics.  CT of the head concerning for a large right MCA stroke.  Significant edema.  I discussed the results with the patient's son.  After some discussion agreed comfort care.  The patients results and plan were reviewed and discussed.   Any x-rays performed were independently reviewed by myself.   Differential diagnosis were considered with the presenting HPI.  Medications  acetaminophen  (TYLENOL ) tablet 650 mg (has no administration in time range)    Or  acetaminophen  (TYLENOL ) suppository 650 mg (has no administration in time range)  glycopyrrolate  (ROBINUL ) tablet 1 mg (has no administration in time range)    Or   glycopyrrolate  (ROBINUL ) injection 0.2 mg (has no administration in time range)    Or  glycopyrrolate  (ROBINUL ) injection 0.2 mg (has no administration in time range)  artificial tears ophthalmic solution 1 drop (has no administration in time range)  HYDROmorphone  (DILAUDID ) bolus via infusion 1 mg (has no administration in time range)  HYDROmorphone  (DILAUDID ) 50 mg in 50 mL NS (1mg /mL) premix infusion (2.5 mg/hr Intravenous Rate/Dose Change 04/15/24 2324)  LORazepam  (ATIVAN ) injection 2-4 mg (has no administration in time range)  haloperidol  lactate (HALDOL ) injection 2.5-5 mg (has no administration in time  range)  albuterol  (PROVENTIL ) (2.5 MG/3ML) 0.083% nebulizer solution 2.5 mg (has no administration in time range)  prochlorperazine  (COMPAZINE ) tablet 10 mg (has no administration in time range)    Or  prochlorperazine  (COMPAZINE ) suppository 25 mg (has no administration in time range)    Or  prochlorperazine  (COMPAZINE ) injection 10 mg (has no administration in time range)  antiseptic oral rinse (BIOTENE) solution 15 mL (has no administration in time range)  vancomycin  (VANCOCIN ) IVPB 1000 mg/200 mL premix (0 mg Intravenous Stopped 04/15/24 2002)  piperacillin -tazobactam (ZOSYN ) IVPB 3.375 g (0 g Intravenous Stopped 04/15/24 2002)    Vitals:   04/15/24 2130 04/15/24 2145 04/15/24 2200 04/15/24 2215  BP:      Pulse: (!) 59 (!) 59 65 68  Resp: 12 16 17 16   Temp:      TempSrc:      SpO2: 98% 95% 93% 90%    Final diagnoses:  Acute ischemic right MCA stroke (HCC)    Admission/ observation were discussed with the admitting physician, patient and/or family and they are comfortable with the plan.       Final diagnoses:  Acute ischemic right MCA stroke [facility])    ED Discharge Orders     None          Emil Share, DO 04/15/24 2341  "

## 2024-04-15 NOTE — ED Triage Notes (Addendum)
 Pt to ED via [facility] c/o pt unresponsive. Pt family spoke with pt last night at 930pm, pt appeared to be in normal state of health, called this morning with no answer, came to the home at 1530, pt appeared to be sleeping, so family let her rest, around 1630, tried to wake pt up, not successful, called 911. GCS 5.   Reported pt DNR family told medic.   No medications given by EMS. #20 L[place] NPA   Last VS: 200/100, hr70, 20rr, 100%NRB, 148CBG.

## 2024-04-15 NOTE — Hospital Course (Addendum)
 Kristi Morales is a 89 y.o. female with a history of paroxysmal atrial fibrillation, hypothyroidism, hypertension, GI bleed.  Patient presented unresponsive. Initial CT head significant for an acute right MCA territory infarct with focal occlusion of the right MCA, with associated mild dilation of the left lateral ventricle. After family discussion, decision was made to transition to comfort measures only. Patient remained unresponsive. Dilaudid  IV drip started for pain management.

## 2024-04-16 DIAGNOSIS — I63511 Cerebral infarction due to unspecified occlusion or stenosis of right middle cerebral artery: Secondary | ICD-10-CM | POA: Diagnosis not present

## 2024-04-16 DIAGNOSIS — Z515 Encounter for palliative care: Secondary | ICD-10-CM | POA: Diagnosis not present

## 2024-04-16 LAB — BLOOD CULTURE ID PANEL (REFLEXED) - BCID2

## 2024-04-17 LAB — CULTURE, BLOOD (ROUTINE X 2): Special Requests: ADEQUATE

## 2024-04-17 NOTE — Progress Notes (Signed)
 Wasted approximately 37.5 mg of dilaudid  in CSRX with Darice LPN.

## 2024-04-22 ENCOUNTER — Ambulatory Visit: Admitting: Cardiology

## 2024-05-02 NOTE — Progress Notes (Signed)
" ° °  CC:  Chief Complaint  Patient presents with   Altered Mental Status     Subjective:  Patient is unresponsive. Stable overnight.  Objective:  BP (!) 146/80   Pulse 63   Temp 99.3 F (37.4 C) (Axillary)   Resp (!) 8   SpO2 92%   General exam: Appears calm and comfortable. Respiratory system: Respiratory effort normal.  Assessment/Plan:  Acute right MCA territory infarct Hydrocephalus Paroxysmal atrial fibrillation Primary hypertension Hypothyroidism Patient presented unresponsive. Initial CT head significant for an acute right MCA territory infarct with focal occlusion of the right MCA, with associated mild dilation of the left lateral ventricle. After family discussion, decision was made to transition to comfort measures only. Patient remained unresponsive. Dilaudid  IV drip started. Anticipate in-hospital death.  No family available at bedside  Elgin Lam, MD [facility] 04/19/24, 12:49 PM  "

## 2024-05-02 NOTE — Progress Notes (Signed)
 Doing rounds went to check on patient. Patient was not breathing. Checked for patients pulse and listened with the stethoscope. Charge Nurse informed Malena C RN)  she confirmed patients death. Pt passed at  1145 pm provider notified. Post mortem care done and patient transferred to the morgue honor bridge notified.

## 2024-05-02 NOTE — Death Summary Note (Signed)
 "  DEATH SUMMARY   Patient Details  Name: Kristi Morales MRN: [id number] DOB: May 13, 1927 ERE:Dnluy, Garnette, MD Admission/Discharge Information   Admit Date:  Apr 26, 2024  Date of Death: Date of Death: 04/27/2024  Time of Death: Time of Death: 2343/05/08  Length of Stay: 1   Principle Cause of death: Acute left MCA stroke  Hospital Diagnoses: Principal Problem:   Acute right MCA stroke [facility]) Active Problems:   Paroxysmal atrial fibrillation (HCC)   Hypothyroidism   Comfort measures only status   Hospital Course: Kristi Morales is a 89 y.o. female with a history of paroxysmal atrial fibrillation, hypothyroidism, hypertension, GI bleed.  Patient presented unresponsive. Initial CT head significant for an acute right MCA territory infarct with focal occlusion of the right MCA, with associated mild dilation of the left lateral ventricle. After family discussion, decision was made to transition to comfort measures only. Patient remained unresponsive. Dilaudid  IV drip started for pain management.   Procedures: None  Consultations: None  The results of significant diagnostics from this hospitalization (including imaging, microbiology, ancillary and laboratory) are listed below for reference.   Significant Diagnostic Studies: CT Head Wo Contrast Result Date: 04-26-24 EXAM: CT HEAD WITHOUT CONTRAST April 26, 2024 07:42:46 PM TECHNIQUE: CT of the head was performed without the administration of intravenous contrast. Automated exposure control, iterative reconstruction, and/or weight based adjustment of the mA/kV was utilized to reduce the radiation dose to as low as reasonably achievable. COMPARISON: 07/26/2023 CLINICAL HISTORY: Mental status change, unknown cause. FINDINGS: BRAIN AND VENTRICLES: Endotracheal tube in place. Loss of gray-white differentiation throughout right MCA territory, with edema and sulcal effacement. Increased density is noted in the right middle cerebral artery consistent  with focal occlusion. Mild effacement of right lateral ventricle. Dilatation of left lateral ventricle, likely developing hydrocephalus. Atherosclerotic calcifications in intracranial carotid and vertebral arteries. No acute hemorrhage. No extra-axial collection. No mass effect or midline shift. ORBITS: Status post bilateral lens replacements. No acute abnormality. SINUSES: Mild mucosal thickening throughout paranasal sinuses. SOFT TISSUES AND SKULL: No acute soft tissue abnormality. No skull fracture. IMPRESSION: 1. Acute right MCA territory infarct with focal occlusion of the right MCA. 2. Mild dilatation of the left lateral ventricle, concerning for developing hydrocephalus. Critical findings were discussed with Dr. Emil at  07:50 pm EST on 04-26-2024 Electronically signed by: Oneil Devonshire MD April 26, 2024 07:51 PM EST RP Workstation: [id number]   DG Chest Port 1 View Result Date: 04-26-24 CLINICAL DATA:  Questionable sepsis, unresponsive EXAM: PORTABLE CHEST 1 VIEW COMPARISON:  07/26/2023 FINDINGS: Single frontal view of the chest demonstrates stable enlargement of the cardiac silhouette. There is pulmonary vascular congestion without acute airspace disease. Small right pleural effusion. No pneumothorax. IMPRESSION: 1. Congestive heart failure, with increased pulmonary vascular congestion. 2. Small right pleural effusion. Electronically Signed   By: Ozell Daring M.D.   On: 04-26-24 18:31   CT ABDOMEN PELVIS WO CONTRAST Result Date: 04/07/2024 CLINICAL DATA:  weight loss, abdominal pain. EXAM: CT ABDOMEN AND PELVIS WITHOUT CONTRAST TECHNIQUE: Multidetector CT imaging of the abdomen and pelvis was performed following the standard protocol without IV contrast. RADIATION DOSE REDUCTION: This exam was performed according to the departmental dose-optimization program which includes automated exposure control, adjustment of the mA and/or kV according to patient size and/or use of iterative reconstruction  technique. COMPARISON:  None Available. FINDINGS: Lower chest: There is small right pleural effusion. No left pleural effusion. Bilateral imaged lungs are otherwise within normal limits. Top-normal heart size. No pericardial effusion. Dense  mitral annulus calcification noted. Hepatobiliary: The liver is normal in size. Non-cirrhotic configuration. No suspicious mass. No intrahepatic or extrahepatic bile duct dilation. Contracted gallbladder containing small volume dependent calcified gallstones versus sludge without imaging signs of acute cholecystitis. Pancreas: Unremarkable. No pancreatic ductal dilatation or surrounding inflammatory changes. Spleen: Within normal limits. No focal lesion. Adrenals/Urinary Tract: There is diffuse thickening of bilateral adrenal glands, without discrete nodule. Findings are nonspecific but mostly associated with adrenal hyperplasia. No suspicious renal mass within the limitations of this unenhanced exam. There is a partially exophytic simple cyst arising from the right kidney upper pole, anteromedially measuring up to 2.4 x 2.5 cm. No nephroureterolithiasis or obstructive uropathy on either side. Bilateral vascular calcifications noted. Unremarkable urinary bladder. Stomach/Bowel: No disproportionate dilation of the small or large bowel loops. No evidence of abnormal bowel wall thickening or inflammatory changes. The appendix was not visualized; however there is no acute inflammatory process in the right lower quadrant. There are multiple diverticula throughout the colon, without imaging signs of diverticulitis. Vascular/Lymphatic: No ascites or pneumoperitoneum. No abdominal or pelvic lymphadenopathy, by size criteria. No aneurysmal dilation of the major abdominal arteries. There are marked peripheral atherosclerotic vascular calcifications of the aorta and its major branches. Reproductive: The uterus is unremarkable. No large adnexal mass. Other: The visualized soft tissues and  abdominal wall are unremarkable. Musculoskeletal: No suspicious osseous lesions. There are mild multilevel degenerative changes in the visualized spine. IMPRESSION: 1. No acute inflammatory process identified within the abdomen or pelvis. 2. No nephroureterolithiasis or obstructive uropathy on either side. 3. Multiple other nonacute observations (such as small right pleural effusion, right renal cyst, etc.), as described above. Aortic Atherosclerosis (ICD10-I70.0). Electronically Signed   By: Ree Molt M.D.   On: 04/07/2024 14:39    Microbiology: Recent Results (from the past 240 hours)  Blood Culture (routine x 2)     Status: Abnormal   Collection Time: 04/15/24  5:59 PM   Specimen: BLOOD  Result Value Ref Range Status   Specimen Description BLOOD RIGHT ANTECUBITAL  Final   Special Requests   Final    BOTTLES DRAWN AEROBIC AND ANAEROBIC Blood Culture adequate volume   Culture  Setup Time   Final    GRAM POSITIVE COCCI AEROBIC BOTTLE ONLY CRITICAL RESULT CALLED TO, READ BACK BY AND VERIFIED WITH: PHARMD ESABRA SLADE [id number] @ 1720 FH    Culture (A)  Final    STAPHYLOCOCCUS HOMINIS THE SIGNIFICANCE OF ISOLATING THIS ORGANISM FROM A SINGLE VENIPUNCTURE CANNOT BE PREDICTED WITHOUT FURTHER CLINICAL AND CULTURE CORRELATION. SUSCEPTIBILITIES AVAILABLE ONLY ON REQUEST. Performed at [facility], [address] [place], [place] [address]    Report Status 04/17/2024 FINAL  Final  Blood Culture ID Panel (Reflexed)     Status: Abnormal   Collection Time: 04/15/24  5:59 PM  Result Value Ref Range Status   Enterococcus faecalis NOT DETECTED NOT DETECTED Final   Enterococcus Faecium NOT DETECTED NOT DETECTED Final   Listeria monocytogenes NOT DETECTED NOT DETECTED Final   Staphylococcus species DETECTED (A) NOT DETECTED Final    Comment: CRITICAL RESULT CALLED TO, READ BACK BY AND VERIFIED WITH: PHARMD ESABRA SLADE [id number] @ 1720 FH    Staphylococcus aureus (BCID) NOT DETECTED NOT  DETECTED Final   Staphylococcus epidermidis NOT DETECTED NOT DETECTED Final   Staphylococcus lugdunensis NOT DETECTED NOT DETECTED Final   Streptococcus species NOT DETECTED NOT DETECTED Final   Streptococcus agalactiae NOT DETECTED NOT DETECTED Final   Streptococcus pneumoniae NOT  DETECTED NOT DETECTED Final   Streptococcus pyogenes NOT DETECTED NOT DETECTED Final   A.calcoaceticus-baumannii NOT DETECTED NOT DETECTED Final   Bacteroides fragilis NOT DETECTED NOT DETECTED Final   Enterobacterales NOT DETECTED NOT DETECTED Final   Enterobacter cloacae complex NOT DETECTED NOT DETECTED Final   Escherichia coli NOT DETECTED NOT DETECTED Final   Klebsiella aerogenes NOT DETECTED NOT DETECTED Final   Klebsiella oxytoca NOT DETECTED NOT DETECTED Final   Klebsiella pneumoniae NOT DETECTED NOT DETECTED Final   Proteus species NOT DETECTED NOT DETECTED Final   Salmonella species NOT DETECTED NOT DETECTED Final   Serratia marcescens NOT DETECTED NOT DETECTED Final   Haemophilus influenzae NOT DETECTED NOT DETECTED Final   Neisseria meningitidis NOT DETECTED NOT DETECTED Final   Pseudomonas aeruginosa NOT DETECTED NOT DETECTED Final   Stenotrophomonas maltophilia NOT DETECTED NOT DETECTED Final   Candida albicans NOT DETECTED NOT DETECTED Final   Candida auris NOT DETECTED NOT DETECTED Final   Candida glabrata NOT DETECTED NOT DETECTED Final   Candida krusei NOT DETECTED NOT DETECTED Final   Candida parapsilosis NOT DETECTED NOT DETECTED Final   Candida tropicalis NOT DETECTED NOT DETECTED Final   Cryptococcus neoformans/gattii NOT DETECTED NOT DETECTED Final    Comment: Performed at [facility], [address], [place], [place] [address]    Signed: Elgin Lam, MD 04/17/24   "

## 2024-05-02 NOTE — Progress Notes (Signed)
 PHARMACY - PHYSICIAN COMMUNICATION CRITICAL VALUE ALERT - BLOOD CULTURE IDENTIFICATION (BCID)  Kristi Morales is an 89 y.o. female who presented to [facility] on 04/15/2024 with a chief complaint of unresponsiveness.  Assessment:  1 of 2 bottles (1 set), likely contaminant  Name of physician (or Provider) Contacted: Dr. Briana  Current antibiotics: none  Changes to prescribed antibiotics recommended:  Recommendations accepted by provider - no antibiotics, patient is comfort care  Results for orders placed or performed during the hospital encounter of 04/15/24  Blood Culture ID Panel (Reflexed) (Collected: 04/15/2024  5:59 PM)  Result Value Ref Range   Enterococcus faecalis NOT DETECTED NOT DETECTED   Enterococcus Faecium NOT DETECTED NOT DETECTED   Listeria monocytogenes NOT DETECTED NOT DETECTED   Staphylococcus species DETECTED (A) NOT DETECTED   Staphylococcus aureus (BCID) NOT DETECTED NOT DETECTED   Staphylococcus epidermidis NOT DETECTED NOT DETECTED   Staphylococcus lugdunensis NOT DETECTED NOT DETECTED   Streptococcus species NOT DETECTED NOT DETECTED   Streptococcus agalactiae NOT DETECTED NOT DETECTED   Streptococcus pneumoniae NOT DETECTED NOT DETECTED   Streptococcus pyogenes NOT DETECTED NOT DETECTED   A.calcoaceticus-baumannii NOT DETECTED NOT DETECTED   Bacteroides fragilis NOT DETECTED NOT DETECTED   Enterobacterales NOT DETECTED NOT DETECTED   Enterobacter cloacae complex NOT DETECTED NOT DETECTED   Escherichia coli NOT DETECTED NOT DETECTED   Klebsiella aerogenes NOT DETECTED NOT DETECTED   Klebsiella oxytoca NOT DETECTED NOT DETECTED   Klebsiella pneumoniae NOT DETECTED NOT DETECTED   Proteus species NOT DETECTED NOT DETECTED   Salmonella species NOT DETECTED NOT DETECTED   Serratia marcescens NOT DETECTED NOT DETECTED   Haemophilus influenzae NOT DETECTED NOT DETECTED   Neisseria meningitidis NOT DETECTED NOT DETECTED   Pseudomonas aeruginosa NOT  DETECTED NOT DETECTED   Stenotrophomonas maltophilia NOT DETECTED NOT DETECTED   Candida albicans NOT DETECTED NOT DETECTED   Candida auris NOT DETECTED NOT DETECTED   Candida glabrata NOT DETECTED NOT DETECTED   Candida krusei NOT DETECTED NOT DETECTED   Candida parapsilosis NOT DETECTED NOT DETECTED   Candida tropicalis NOT DETECTED NOT DETECTED   Cryptococcus neoformans/gattii NOT DETECTED NOT DETECTED    Rocky Slade, PharmD, BCPS May 01, 2024  5:37 PM

## 2024-05-02 DEATH — deceased
